# Patient Record
Sex: Male | Born: 2000 | Race: White | Hispanic: No | Marital: Married | Smoking: Never smoker
Health system: Southern US, Community
[De-identification: ages and names within clinical notes are randomized; demographics above are authoritative.]

## PROBLEM LIST (undated history)

## (undated) DIAGNOSIS — F845 Asperger's syndrome: Secondary | ICD-10-CM

## (undated) DIAGNOSIS — F809 Developmental disorder of speech and language, unspecified: Secondary | ICD-10-CM

## (undated) DIAGNOSIS — Q673 Plagiocephaly: Secondary | ICD-10-CM

## (undated) DIAGNOSIS — J45909 Unspecified asthma, uncomplicated: Secondary | ICD-10-CM

## (undated) DIAGNOSIS — F909 Attention-deficit hyperactivity disorder, unspecified type: Secondary | ICD-10-CM

## (undated) DIAGNOSIS — T7840XA Allergy, unspecified, initial encounter: Secondary | ICD-10-CM

## (undated) DIAGNOSIS — H9325 Central auditory processing disorder: Secondary | ICD-10-CM

## (undated) DIAGNOSIS — Z9109 Other allergy status, other than to drugs and biological substances: Secondary | ICD-10-CM

## (undated) DIAGNOSIS — K200 Eosinophilic esophagitis: Secondary | ICD-10-CM

## (undated) DIAGNOSIS — S098XXA Other specified injuries of head, initial encounter: Secondary | ICD-10-CM

## (undated) DIAGNOSIS — F819 Developmental disorder of scholastic skills, unspecified: Secondary | ICD-10-CM

## (undated) DIAGNOSIS — K219 Gastro-esophageal reflux disease without esophagitis: Secondary | ICD-10-CM

## (undated) HISTORY — DX: Other specified injuries of head, initial encounter: S09.8XXA

## (undated) HISTORY — DX: Plagiocephaly: Q67.3

## (undated) HISTORY — DX: Developmental disorder of speech and language, unspecified: F80.9

## (undated) HISTORY — DX: Developmental disorder of scholastic skills, unspecified: F81.9

## (undated) HISTORY — DX: Eosinophilic esophagitis: K20.0

## (undated) HISTORY — DX: Central auditory processing disorder: H93.25

## (undated) HISTORY — DX: Attention-deficit hyperactivity disorder, unspecified type: F90.9

## (undated) HISTORY — DX: Other allergy status, other than to drugs and biological substances: Z91.09

## (undated) HISTORY — PX: CIRCUMCISION: SUR203

## (undated) HISTORY — PX: UPPER GASTROINTESTINAL ENDOSCOPY: SHX188

## (undated) HISTORY — DX: Unspecified asthma, uncomplicated: J45.909

## (undated) HISTORY — DX: Gastro-esophageal reflux disease without esophagitis: K21.9

## (undated) HISTORY — DX: Allergy, unspecified, initial encounter: T78.40XA

## (undated) HISTORY — DX: Asperger's syndrome: F84.5

## (undated) HISTORY — PX: MANDIBLE FRACTURE SURGERY: SHX706

---
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
## 2019-05-17 ENCOUNTER — Ambulatory Visit: Payer: BLUE CROSS/BLUE SHIELD | Admitting: Podiatry

## 2019-05-17 ENCOUNTER — Ambulatory Visit: Payer: BLUE CROSS/BLUE SHIELD

## 2019-05-17 ENCOUNTER — Other Ambulatory Visit: Payer: Self-pay | Admitting: Podiatry

## 2019-05-17 ENCOUNTER — Ambulatory Visit (INDEPENDENT_AMBULATORY_CARE_PROVIDER_SITE_OTHER): Payer: BLUE CROSS/BLUE SHIELD

## 2019-05-17 ENCOUNTER — Encounter: Payer: Self-pay | Admitting: Podiatry

## 2019-05-17 ENCOUNTER — Other Ambulatory Visit: Payer: Self-pay

## 2019-05-17 VITALS — Temp 97.7°F

## 2019-05-17 DIAGNOSIS — S93491A Sprain of other ligament of right ankle, initial encounter: Secondary | ICD-10-CM

## 2019-05-17 DIAGNOSIS — R600 Localized edema: Secondary | ICD-10-CM | POA: Diagnosis not present

## 2019-05-17 DIAGNOSIS — S93621A Sprain of tarsometatarsal ligament of right foot, initial encounter: Secondary | ICD-10-CM

## 2019-05-17 DIAGNOSIS — M79671 Pain in right foot: Secondary | ICD-10-CM

## 2019-05-17 NOTE — Progress Notes (Signed)
Subjective:  Patient ID: Javier Greene, male    DOB: November 18, 2001,  MRN: [id number]  Chief Complaint  Patient presents with  . Foot Injury    Pt states sprained right foot 5 days ago when he slipped and bent his foot. Pt states pain has improved slightly since then but still has pain when walking and moving his toes on right foot.   18 y.o. male presents with the above complaint.  Mechanism of injury was slipping off of a tire from approximately a 8 inch height.   Review of Systems: Negative except as noted in the HPI. Denies N/V/F/Ch.  Past Medical History:  Diagnosis Date  . ADHD (attention deficit hyperactivity disorder)   . Allergy   . Asthma    severe when younger, now Albuterol MDI prn, no controllers  . Blunt head trauma 06/26/2000   [place] SeatFell forward and hit head on floor, seen in ER, CT SCAN normal  . Developmental speech or language disorder   . Environmental allergies 07/09/2012  . GERD (gastroesophageal reflux disease) 2001-02   Zantac, Prilosec. Resolved.  . Learning disability   . Neonatal jaundice    Peak bili 11.4  . Positional plagiocephaly    helmet, [facility]    Current Outpatient Medications:  .  EPINEPHrine (EPIPEN) 0.3 mg/0.3 mL DEVI, Inject 0.3 mLs (0.3 mg total) into the muscle once., Disp: 1 Device, Rfl: 1 .  levocetirizine (XYZAL) 5 MG tablet, Take 5 mg by mouth every evening., Disp: , Rfl:  .  methylphenidate (METADATE ER) 10 MG ER tablet, Take 1 tablet (10 mg total) by mouth every morning., Disp: 30 tablet, Rfl: 0 .  omeprazole (PRILOSEC) 20 MG capsule, Take by mouth., Disp: , Rfl:  .  cetirizine (ZYRTEC) 10 MG tablet, Take 1 tablet (10 mg total) by mouth daily., Disp: 30 tablet, Rfl: 1 .  fluticasone (FLONASE) 50 MCG/ACT nasal spray, Place 1 spray into both nostrils daily., Disp: 16 g, Rfl: 12  Social History   Tobacco Use  Smoking Status Never Smoker    Allergies  Allergen Reactions  . Latex Swelling  . Leguminosae [Peanut Oil] Nausea And Vomiting   . Shellfish Allergy Hives and Itching  . Eggs Or Egg-Derived Products Rash   Objective:   Vitals:   05/17/19 0900  Temp: 97.7 F (36.5 C)   There is no height or weight on file to calculate BMI. Constitutional Well developed. Well nourished.  Vascular Dorsalis pedis pulses palpable bilaterally. Posterior tibial pulses palpable bilaterally. Capillary refill normal to all digits.  No cyanosis or clubbing noted. Pedal hair growth normal.  Neurologic Normal speech. Oriented to person, place, and time. Epicritic sensation to light touch grossly present bilaterally.  Dermatologic Nails well groomed and normal in appearance. No open wounds. No skin lesions.  Orthopedic: POP R ATFL, No pain at CFL or Deltoids POP about the 1st/2nd TMTs with pain with piano key tests 1st/2nd TMTs   Radiographs: Taken and reviewed no acute fractures no evidence of Lisfranc dislocation no chip fracture. Assessment:   1. Sprain of anterior talofibular ligament of right ankle, initial encounter   2. Lisfranc's sprain, right, initial encounter   3. Localized edema    Plan:  Patient was evaluated and treated and all questions answered.  Right ATFL sprain, Lisfranc sprain, localized edema -X-rays reviewed as above -We will reduce edema with Unna boot -Immobilized in cam boot -Follow-up in 2 weeks with repeat weightbearing x-rays bilateral feet to prepare Lisfranc joint to make sure  there is no instability  Return in about 2 weeks (around 05/31/2019) for Ankle/Midfoot Sprain F/u with new XRs.

## 2019-05-31 ENCOUNTER — Ambulatory Visit (INDEPENDENT_AMBULATORY_CARE_PROVIDER_SITE_OTHER): Payer: BC Managed Care – PPO

## 2019-05-31 ENCOUNTER — Other Ambulatory Visit: Payer: Self-pay

## 2019-05-31 ENCOUNTER — Ambulatory Visit (INDEPENDENT_AMBULATORY_CARE_PROVIDER_SITE_OTHER): Payer: BC Managed Care – PPO | Admitting: Podiatry

## 2019-05-31 DIAGNOSIS — S93621D Sprain of tarsometatarsal ligament of right foot, subsequent encounter: Secondary | ICD-10-CM

## 2019-05-31 DIAGNOSIS — S93324S Dislocation of tarsometatarsal joint of right foot, sequela: Secondary | ICD-10-CM

## 2019-05-31 DIAGNOSIS — S93622S Sprain of tarsometatarsal ligament of left foot, sequela: Secondary | ICD-10-CM

## 2019-05-31 DIAGNOSIS — S93491D Sprain of other ligament of right ankle, subsequent encounter: Secondary | ICD-10-CM

## 2019-06-02 NOTE — Progress Notes (Signed)
Subjective:  Patient ID: Javier Greene, male    DOB: 2001-04-02,  MRN: [id number]  Chief Complaint  Patient presents with  . Ankle Injury    3 week follow up right ankle sprain   18 y.o. male presents with the above complaint.  States his ankle is feeling a little better still having pain in the midfoot.  Wearing the boot as directed  Review of Systems: Negative except as noted in the HPI. Denies N/V/F/Ch.  Past Medical History:  Diagnosis Date  . ADHD (attention deficit hyperactivity disorder)   . Allergy   . Asthma    severe when younger, now Albuterol MDI prn, no controllers  . Blunt head trauma 06/26/2000   [place] forward and hit head on floor, seen in ER, CT SCAN normal  . Developmental speech or language disorder   . Environmental allergies 07/09/2012  . GERD (gastroesophageal reflux disease) 2001-02   Zantac, Prilosec. Resolved.  . Learning disability   . Neonatal jaundice    Peak bili 11.4  . Positional plagiocephaly    helmet, [employer]    Current Outpatient Medications:  .  cetirizine (ZYRTEC) 10 MG tablet, Take 1 tablet (10 mg total) by mouth daily., Disp: 30 tablet, Rfl: 1 .  EPINEPHrine (EPIPEN) 0.3 mg/0.3 mL DEVI, Inject 0.3 mLs (0.3 mg total) into the muscle once., Disp: 1 Device, Rfl: 1 .  fluticasone (FLONASE) 50 MCG/ACT nasal spray, Place 1 spray into both nostrils daily., Disp: 16 g, Rfl: 12 .  levocetirizine (XYZAL) 5 MG tablet, Take 5 mg by mouth every evening., Disp: , Rfl:  .  methylphenidate (METADATE ER) 10 MG ER tablet, Take 1 tablet (10 mg total) by mouth every morning., Disp: 30 tablet, Rfl: 0 .  omeprazole (PRILOSEC) 20 MG capsule, Take by mouth., Disp: , Rfl:   Social History   Tobacco Use  Smoking Status Never Smoker    Allergies  Allergen Reactions  . Latex Swelling  . Leguminosae [Peanut Oil] Nausea And Vomiting  . Shellfish Allergy Hives and Itching  . Eggs Or Egg-Derived Products Rash   Objective:   There were no vitals filed for this  visit. There is no height or weight on file to calculate BMI. Constitutional Well developed. Well nourished.  Vascular Dorsalis pedis pulses palpable bilaterally. Posterior tibial pulses palpable bilaterally. Capillary refill normal to all digits.  No cyanosis or clubbing noted. Pedal hair growth normal.  Neurologic Normal speech. Oriented to person, place, and time. Epicritic sensation to light touch grossly present bilaterally.  Dermatologic Nails well groomed and normal in appearance. No open wounds. No skin lesions.  Orthopedic:  Pain palpation of the right ATFL but decreases prior.  Pain to palpation about the right second TMT.   Radiographs: Taken and reviewed bilaterally.  First and second intermetatarsal distance at the tarsometatarsal joints equal bilaterally without evidence of chip fracture or avulsion Assessment:   1. Sprain of anterior talofibular ligament of right ankle, subsequent encounter   2. Lisfranc's sprain, right, subsequent encounter   3. Lisfranc's sprain, left, sequela    Plan:  Patient was evaluated and treated and all questions answered.  Right ATFL sprain, Lisfranc sprain, localized edema -Bilateral x-rays weightbearing performed as above.  He is still having some tenderness at the Lisfranc ligament and so we will immobilize him in a Tri-Lock ankle brace with additional foot strap to prevent midfoot inversion eversion.  He will be allowed to weight-bear in a normal shoe.  He will follow-up in 2  weeks for re-eval  Return in about 2 weeks (around 06/14/2019) for Sprain f/u right .

## 2019-06-13 ENCOUNTER — Ambulatory Visit: Payer: BC Managed Care – PPO | Admitting: Podiatry

## 2022-06-23 ENCOUNTER — Encounter: Payer: Self-pay | Admitting: Internal Medicine

## 2022-08-12 ENCOUNTER — Encounter: Payer: Self-pay | Admitting: *Deleted

## 2022-08-16 ENCOUNTER — Encounter: Payer: Self-pay | Admitting: Internal Medicine

## 2022-08-16 ENCOUNTER — Ambulatory Visit (INDEPENDENT_AMBULATORY_CARE_PROVIDER_SITE_OTHER): Payer: BC Managed Care – PPO | Admitting: Internal Medicine

## 2022-08-16 VITALS — BP 124/88 | HR 81 | Ht 71.0 in | Wt 180.5 lb

## 2022-08-16 DIAGNOSIS — R1310 Dysphagia, unspecified: Secondary | ICD-10-CM | POA: Diagnosis not present

## 2022-08-16 DIAGNOSIS — R112 Nausea with vomiting, unspecified: Secondary | ICD-10-CM | POA: Diagnosis not present

## 2022-08-16 DIAGNOSIS — K219 Gastro-esophageal reflux disease without esophagitis: Secondary | ICD-10-CM

## 2022-08-16 NOTE — Patient Instructions (Signed)
If you are age 23 or older, your body mass index should be between 23-30. Your Body mass index is 25.17 kg/m. If this is out of the aforementioned range listed, please consider follow up with your Primary Care Provider.  If you are age 46 or younger, your body mass index should be between 19-25. Your Body mass index is 25.17 kg/m. If this is out of the aformentioned range listed, please consider follow up with your Primary Care Provider.   You have been scheduled for an endoscopy. Please follow written instructions given to you at your visit today. If you use inhalers (even only as needed), please bring them with you on the day of your procedure.  The [facility] providers would like to encourage you to use [facility] to communicate with providers for non-urgent requests or questions.  Due to long hold times on the telephone, sending your provider a message by [facility] may be a faster and more efficient way to get a response.  Please allow 48 business hours for a response.  Please remember that this is for non-urgent requests.   It was a pleasure to see you today!  Thank you for trusting me with your gastrointestinal care!

## 2022-08-16 NOTE — Progress Notes (Signed)
   Subjective:    Patient ID: Javier Greene, male    DOB: January 18, 2001, 21 y.o.   MRN: [id number]  HPI [name] is a 21 year old male with a history of esophageal dysphagia and reflux who is seen in consultation at the request of Calla Kicks, NP to evaluate mid abdominal pain and dysphagia symptom.  He is here today with his stepparents.  Records reviewed including ER visit from June 2019 where he presented after what sounds like esophageal food impaction.  This did not require EGD at that time. He also had an esophagram done at 12 months old which showed free reflux.  He reports that he has issues with solid food dysphagia.  This occurs on a regular though not daily basis.  Worse with foods like chicken, bread and rice.  Has to push food down with fluids.  Does not have to bring it back up.  At times it feels like food moves slowly into his stomach.  Occasional nausea and on 3 times he vomited after eating.  He recalls a coughing sensation followed by vomiting of food recently eaten. No nausea or vomiting.  He has some heartburn.  No belching symptom.  Has dealt with this for so long that he cannot really recall when it was not occurring intermittently.  Is currently taking no medications other than daily Zyrtec for allergies.  Bowel movements are normal for him which occur 1-3 times a day.  Can be loose at times other times are normal.  Rarely constipated.  No blood in stool or melena.  Lives in [place].  No tobacco use.  Rare alcohol use.  Is engaged to be married with plans for a wedding in 2025.   Review of Systems As per HPI, otherwise negative  Current Medications, Allergies, Past Medical History, Past Surgical History, Family History and Social History were reviewed in [employer] record.    Objective:   Physical Exam BP 124/88   Pulse 81   Ht 5\' 11"  (1.803 m)   Wt 180 lb 8 oz (81.9 kg)   BMI 25.17 kg/m  Gen: awake, alert, NAD HEENT: anicteric  CV: RRR,  no mrg Pulm: CTA b/l Abd: soft, NT/ND, +BS throughout Ext: no c/c/e Neuro: nonfocal      Assessment & Plan:  21 year old male with a history of esophageal dysphagia and reflux who is seen in consultation at the request of 30, NP to evaluate mid abdominal pain and dysphagia symptom.   Dysphagia/upper abdominal pain/heartburn/nausea vomiting --he has symptoms of GERD which are longstanding in nature and in 2019 had a transient esophageal food impaction.  This raises the question of GERD with esophagitis, esophageal stricture or EOE.  He is not currently taking any acid suppression therapy.  I recommended the following: -- Upper endoscopy in the [facility] with possible dilation; we discussed the risk, benefits and alternatives and he is agreeable and wishes to proceed -- We discussed the need for possible acid suppression therapy but will hold off until after upper endoscopy

## 2022-08-18 ENCOUNTER — Encounter: Payer: Self-pay | Admitting: Internal Medicine

## 2022-08-18 ENCOUNTER — Ambulatory Visit (AMBULATORY_SURGERY_CENTER): Payer: BC Managed Care – PPO | Admitting: Internal Medicine

## 2022-08-18 VITALS — BP 113/73 | HR 52 | Temp 98.2°F | Resp 15 | Ht 71.0 in | Wt 180.0 lb

## 2022-08-18 DIAGNOSIS — R1319 Other dysphagia: Secondary | ICD-10-CM

## 2022-08-18 DIAGNOSIS — K449 Diaphragmatic hernia without obstruction or gangrene: Secondary | ICD-10-CM

## 2022-08-18 DIAGNOSIS — R12 Heartburn: Secondary | ICD-10-CM

## 2022-08-18 DIAGNOSIS — R112 Nausea with vomiting, unspecified: Secondary | ICD-10-CM | POA: Diagnosis not present

## 2022-08-18 DIAGNOSIS — K200 Eosinophilic esophagitis: Secondary | ICD-10-CM | POA: Diagnosis not present

## 2022-08-18 DIAGNOSIS — K630 Abscess of intestine: Secondary | ICD-10-CM | POA: Diagnosis not present

## 2022-08-18 DIAGNOSIS — K219 Gastro-esophageal reflux disease without esophagitis: Secondary | ICD-10-CM

## 2022-08-18 MED ORDER — FLUTICASONE PROPIONATE HFA 220 MCG/ACT IN AERO: 2.0000 | INHALATION_SPRAY | Freq: Two times a day (BID) | RESPIRATORY_TRACT | Status: DC

## 2022-08-18 MED ORDER — FLUTICASONE PROPIONATE HFA 220 MCG/ACT IN AERO: 2.0000 | INHALATION_SPRAY | Freq: Two times a day (BID) | RESPIRATORY_TRACT | 0 refills | Status: DC

## 2022-08-18 MED ORDER — PANTOPRAZOLE SODIUM 40 MG PO TBEC: 40.0000 mg | DELAYED_RELEASE_TABLET | Freq: Every day | ORAL | 3 refills | Status: DC

## 2022-08-18 MED ORDER — SODIUM CHLORIDE 0.9 % IV SOLN: 500.0000 mL | Freq: Once | INTRAVENOUS | Status: DC

## 2022-08-18 MED ORDER — FLUTICASONE PROPIONATE HFA 220 MCG/ACT IN AERO: 2.0000 | INHALATION_SPRAY | Freq: Two times a day (BID) | RESPIRATORY_TRACT | 1 refills | Status: DC

## 2022-08-18 NOTE — Progress Notes (Signed)
.  rm °

## 2022-08-18 NOTE — Patient Instructions (Addendum)
Pick up new Rx's!!  See post dilation diet handout for diet instructions!  Proceed to clear liquids then soft diet as tolerated, tomorrow proceed to your normal diet as tolerated.  Repeat EGD in 3 months, call to schedule in the next month.  YOU HAD AN ENDOSCOPIC PROCEDURE TODAY AT THE [facility]:   Refer to the procedure report that was given to you for any specific questions about what was found during the examination.  If the procedure report does not answer your questions, please call your gastroenterologist to clarify.  If you requested that your care partner not be given the details of your procedure findings, then the procedure report has been included in a sealed envelope for you to review at your convenience later.  YOU SHOULD EXPECT: Some feelings of bloating in the abdomen. Passage of more gas than usual.  Walking can help get rid of the air that was put into your GI tract during the procedure and reduce the bloating.  Please Note:  You might notice some irritation and congestion in your nose or some drainage.  This is from the oxygen used during your procedure.  There is no need for concern and it should clear up in a day or so.  SYMPTOMS TO REPORT IMMEDIATELY:  Following upper endoscopy (EGD)  Vomiting of blood or coffee ground material  New chest pain or pain under the shoulder blades  Painful or persistently difficult swallowing  New shortness of breath  Fever of 100F or higher  Black, tarry-looking stools  For urgent or emergent issues, a gastroenterologist can be reached at any hour by calling [phone]. Do not use MyChart messaging for urgent concerns.    DIET:  SEE POST DILATION DIET HANDOUT!! Drink plenty of fluids but you should avoid alcoholic beverages for 24 hours.  ACTIVITY:  You should plan to take it easy for the rest of today and you should NOT DRIVE or use heavy machinery until tomorrow (because of the sedation medicines used during the  test).    FOLLOW UP: Our staff will call the number listed on your records the next business day following your procedure.  We will call around 7:15- 8:00 am to check on you and address any questions or concerns that you may have regarding the information given to you following your procedure. If we do not reach you, we will leave a message.  If you develop any symptoms (ie: fever, flu-like symptoms, shortness of breath, cough etc.) before then, please call [phone].  If you test positive for Covid 19 in the 2 weeks post procedure, please call and report this information to [place].    If any biopsies were taken you will be contacted by phone or by letter within the next 1-3 weeks.  Please call us at [phone] if you have not heard about the biopsies in 3 weeks.    SIGNATURES/CONFIDENTIALITY: You and/or your care partner have signed paperwork which will be entered into your electronic medical record.  These signatures attest to the fact that that the information above on your After Visit Summary has been reviewed and is understood.  Full responsibility of the confidentiality of this discharge information lies with you and/or your care-partner.

## 2022-08-18 NOTE — Op Note (Signed)
[facility] Patient Name: Javier Greene Procedure Date: 08/18/2022 9:06 AM MRN: [id number] Endoscopist: Beverley Fiedler , MD Age: 21 Referring MD:  Date of Birth: July 26, 2001 Gender: Male Account #: [id number] Procedure:                Upper GI endoscopy Indications:              Dysphagia, Nausea with vomiting Medicines:                Monitored Anesthesia Care Procedure:                Pre-Anesthesia Assessment:                           - Prior to the procedure, a History and Physical                            was performed, and patient medications and                            allergies were reviewed. The patient's tolerance of                            previous anesthesia was also reviewed. The risks                            and benefits of the procedure and the sedation                            options and risks were discussed with the patient.                            All questions were answered, and informed consent                            was obtained. Prior Anticoagulants: The patient has                            taken no previous anticoagulant or antiplatelet                            agents. ASA Grade Assessment: II - A patient with                            mild systemic disease. After reviewing the risks                            and benefits, the patient was deemed in                            satisfactory condition to undergo the procedure.                           After obtaining informed consent, the endoscope was  passed under direct vision. Throughout the                            procedure, the patient's blood pressure, pulse, and                            oxygen saturations were monitored continuously. The                            Endoscope was introduced through the mouth, and                            advanced to the second part of duodenum. The upper                            GI endoscopy was accomplished  without difficulty.                            The patient tolerated the procedure well. Scope In: Scope Out: Findings:                 Mucosal changes including feline appearance,                            longitudinal furrows and congestion (edema) were                            found in the entire esophagus. Esophageal findings                            were graded using the Eosinophilic Esophagitis                            Endoscopic Reference Score (EoE-EREFS) as: Edema                            Grade 1 Present (decreased clarity or absence of                            vascular markings), Rings Grade 2 Moderate                            (distinct rings that do not occlude passage of                            diagnostic 8-10 mm endoscope), Exudates Grade 1                            Mild (scattered white lesions involving less than                            10 percent of the esophageal surface area) and  Furrows Grade 1 Present (vertical lines with or                            without visible depth). Biopsies were obtained from                            the proximal and distal esophagus with cold forceps                            for histology of suspected eosinophilic                            esophagitis. On scope withdrawal there were                            multiple linear mucosal rents consistent with                            dilation effect from passage of the adult upper                            endoscope.                           A 2 cm hiatal hernia was present.                           The entire examined stomach was normal.                           The examined duodenum was normal. Complications:            No immediate complications. Estimated Blood Loss:     Estimated blood loss was minimal. 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  Biopsied. Effectively                            dilated by scope passage.                           - 2 cm hiatal hernia.                           - Normal stomach.                           - Normal examined duodenum. Recommendation:           - Patient has a contact number available for                            emergencies. The signs and symptoms of potential  delayed complications were discussed with the                            patient. Return to normal activities tomorrow.                            Written discharge instructions were provided to the                            patient.                           - Post-dilation diet, then advance diet as                            tolerated.                           - Continue present medications. Begin pantoprazole                            40 mg once daily.                           - Begin fluticasone 220 mcg; 2 sprays swallowed                            twice daily x 6 weeks (do not use a spacer and do                            not eat or drink for 30 min before or after using                            this medication)                           - Await pathology results.                           - Repeat EGD in 3 months in [facility].                           - Referral to Dr. Irena Cords for food allergy                            testing given EoE. Beverley Fiedler, MD 08/18/2022 9:29:32 AM This report has been signed electronically.

## 2022-08-18 NOTE — Progress Notes (Signed)
Report to PACU, RN, vss, BBS= Clear.  

## 2022-08-18 NOTE — Progress Notes (Signed)
See office note dated 08/16/2022 for details and current H&P  Patient presenting for upper endoscopy to evaluate dysphagia symptom. He is appropriate for upper endoscopy in the [facility] today.

## 2022-08-18 NOTE — Progress Notes (Signed)
VS by DT  Pt's states no medical or surgical changes since previsit or office visit.  

## 2022-08-19 ENCOUNTER — Telehealth: Payer: Self-pay | Admitting: *Deleted

## 2022-08-19 NOTE — Telephone Encounter (Signed)
  Follow up Call-     08/18/2022    8:21 AM  Call back number  Post procedure Call Back phone  # [phone]  Permission to leave phone message Yes     Patient questions:  Do you have a fever, pain , or abdominal swelling? No. Pain Score  0 *  Have you tolerated food without any problems? Yes.    Have you been able to return to your normal activities? Yes.    Do you have any questions about your discharge instructions: Diet   No. Medications  No. Follow up visit  No.  Do you have questions or concerns about your Care? No.  Actions: * If pain score is 4 or above: No action needed, pain <4.

## 2022-08-24 ENCOUNTER — Other Ambulatory Visit (HOSPITAL_COMMUNITY): Payer: Self-pay

## 2022-08-24 ENCOUNTER — Telehealth: Payer: Self-pay

## 2022-08-24 NOTE — Telephone Encounter (Signed)
Referral faxed to Dr. Madie Reno for food allergy testing to [phone]-Attn [facility]. Recall entered in epic for EGD in  [facility] in 3 mths.

## 2022-08-24 NOTE — Telephone Encounter (Signed)
Patient Advocate Encounter   Received notification from [facility] that prior authorization is required for Fluticasone 220 HFA  This insurance prefers Pulmicort flexhaler and will not cover Fluticasone without documentation of trial/failure, or contraindication of Pulmicort. No record of previous use of Pulmicort in this patient chart.  Burnell Blanks, CPhT Rx Patient Advocate Phone: [phone]

## 2022-08-28 NOTE — Telephone Encounter (Signed)
Dry inhaler as requested by insurance provider is not ideal  This med needs to be swallowed and not inhaled for EoE Would try fluticasone 110 mcg/spray -- 4 sprays BID -- sometimes this is covered better Let me know if not covered, he needs the med for EoE

## 2022-08-29 MED ORDER — FLUTICASONE PROPIONATE HFA 110 MCG/ACT IN AERO
INHALATION_SPRAY | RESPIRATORY_TRACT | 0 refills | Status: DC
Start: 2022-08-29 — End: 2022-10-26

## 2022-08-29 NOTE — Telephone Encounter (Signed)
Updated script sent to pharmacy   

## 2022-08-29 NOTE — Addendum Note (Signed)
Addended by: Richardson Chiquito on: 08/29/2022 09:19 AM   Modules accepted: Orders

## 2022-09-05 ENCOUNTER — Other Ambulatory Visit (HOSPITAL_COMMUNITY): Payer: Self-pay

## 2022-09-06 ENCOUNTER — Telehealth: Payer: Self-pay

## 2022-09-06 ENCOUNTER — Other Ambulatory Visit (HOSPITAL_COMMUNITY): Payer: Self-pay

## 2022-09-06 NOTE — Telephone Encounter (Signed)
Patient Advocate Encounter  Received a fax regarding Prior Authorization for Fluticasone Propionate HFA 110MCG/ACT.   Authorization has been DENIED due to *Non-Formulary- Your request for coverage was denied because your prescription benefit plan does not cover the requested medication. The above adverse benefit determination is in accordance with Section 4.01 of the [place]. This decision relates specifically to coverage provided under your prescription benefit plan and does not involve any determination of medical judgement.  Key: [id number]

## 2022-09-07 ENCOUNTER — Other Ambulatory Visit (HOSPITAL_COMMUNITY): Payer: Self-pay

## 2022-09-08 ENCOUNTER — Other Ambulatory Visit (HOSPITAL_COMMUNITY): Payer: Self-pay

## 2022-09-08 NOTE — Telephone Encounter (Signed)
Please advise Dr. Hilarie Fredrickson if you want second appeal.

## 2022-09-08 NOTE — Telephone Encounter (Signed)
Received a fax regarding appeal to Fluticasone Propionate HFA 110MCG/ACT.   Appeal has been Denied.  Determination letter attached to chart

## 2022-09-12 ENCOUNTER — Other Ambulatory Visit (HOSPITAL_COMMUNITY): Payer: Self-pay

## 2022-09-12 NOTE — Telephone Encounter (Signed)
In someway we need a swallowed steroid approved for this patient's eosinophilic esophagitis It is incredibly and hard to imagine why this is so difficult to get coverage as this is standard of care for EoE  Either fluticasone at the equivalent dose of 220 mcg/spray, 2 sprays twice daily or budesonide slurry twice daily for 6 weeks  Let me know if you need documentation for me

## 2022-09-15 ENCOUNTER — Encounter: Payer: BC Managed Care – PPO | Admitting: Internal Medicine

## 2022-09-16 ENCOUNTER — Other Ambulatory Visit (HOSPITAL_COMMUNITY): Payer: Self-pay

## 2022-09-19 NOTE — Telephone Encounter (Signed)
Yes this is acceptable and thanks for working to make this happen The pt needs this med Budesonide slurry 2 mg PO BID (oral slurry solution), instruction should be nothing to eat or drink within 30 min on either side of dose.

## 2022-09-20 NOTE — Telephone Encounter (Signed)
Pts mother states they paid out of pocket for the inhaler and pt has been taking it. She thought this was short term and that is why they went ahead and paid for the inhaler. She does not think he would be able to swallow the slurry without throwing up. Is the inhaler only for short term? Please advise.

## 2022-09-20 NOTE — Telephone Encounter (Signed)
Javier Greene, can you help here to see where pt wants med. Ok for [employer] [facility] or [facility] outpt if easier [employer]

## 2022-09-20 NOTE — Telephone Encounter (Signed)
Left detailed message on mother's voicemail that the inhaler is supposed to be short term. Pt scheduled to see Dr. Hilarie Fredrickson 10/26/22 at 3:40pm. Per Dr. Marjie Skiff office they are behind on referrals and he should be called soon for an appt but they can call the office themselves to go ahead and schedule if they want. Office number given to call [phone].

## 2022-09-20 NOTE — Telephone Encounter (Signed)
Yes, 6-8 weeks dependent on response. Short term expected  He will need followup with me if not in place Thanks JMP

## 2022-10-21 ENCOUNTER — Encounter: Payer: Self-pay | Admitting: *Deleted

## 2022-10-26 ENCOUNTER — Encounter: Payer: Self-pay | Admitting: Internal Medicine

## 2022-10-26 ENCOUNTER — Ambulatory Visit (INDEPENDENT_AMBULATORY_CARE_PROVIDER_SITE_OTHER): Payer: BC Managed Care – PPO | Admitting: Internal Medicine

## 2022-10-26 VITALS — BP 118/82 | HR 83 | Ht 71.0 in | Wt 184.0 lb

## 2022-10-26 DIAGNOSIS — K200 Eosinophilic esophagitis: Secondary | ICD-10-CM

## 2022-10-26 DIAGNOSIS — K222 Esophageal obstruction: Secondary | ICD-10-CM

## 2022-10-26 NOTE — Patient Instructions (Addendum)
   If you are age 23 or older, your body mass index should be between 23-30. Your Body mass index is 25.66 kg/m. If this is out of the aforementioned range listed, please consider follow up with your Primary Care Provider.  If you are age 57 or younger, your body mass index should be between 19-25. Your Body mass index is 25.66 kg/m. If this is out of the aformentioned range listed, please consider follow up with your Primary Care Provider.   ________________________________________________________  The [facility] providers would like to encourage you to use [facility] to communicate with providers for non-urgent requests or questions.  Due to long hold times on the telephone, sending your provider a message by [facility] may be a faster and more efficient way to get a response.  Please allow 48 business hours for a response.  Please remember that this is for non-urgent requests.  _______________________________________________________  Bonita Quin have been scheduled for an endoscopy. Please follow written instructions given to you at your visit today. If you use inhalers (even only as needed), please bring them with you on the day of your procedure.   Due to recent changes in healthcare laws, you may see the results of your imaging and laboratory studies on MyChart before your provider has had a chance to review them.  We understand that in some cases there may be results that are confusing or concerning to you. Not all laboratory results come back in the same time frame and the provider may be waiting for multiple results in order to interpret others.  Please give [place] 48 hours in order for your provider to thoroughly review all the results before contacting the office for clarification of your results.    Continue Pantoprazole  Discontinue fluticasone  Thank you for entrusting me with your care and choosing [facility].  Dr Rhea Belton

## 2022-10-26 NOTE — Progress Notes (Signed)
   Subjective:    Patient ID: Javier Greene, male    DOB: 2001/10/23, 23 y.o.   MRN: [id number]  HPI Deryck Hippler is a 21 year old male with a recent diagnosis of EoE with esophageal dysphagia and GERD who is here for follow-up.  He was seen in the office on 08/16/2022 and for upper endoscopy on 08/18/2022.  EGD revealed endoscopic mucosal changes consistent with EOE is well as moderate distinct rings throughout the middle and lower esophagus.  A 2 cm hiatal hernia was present.  The stomach and examined duodenum were normal.  Mucosal rents were caused in the esophagus just by passing the adult upper endoscope.  Biopsies showed eosinophils too numerous to count.  There was also submucosal fibrosis suggestive of possible longstanding EoE.  He has been adherent with once daily pantoprazole but also using fluticasone.  He is currently using 1 spray twice daily.  His reflux symptoms have resolved.  His swallowing is much improved.  He has had no further nausea.  He has had no food sticking and is able to eat what he wants.  He occasionally feels like solid foods like meat go down slowly but nothing is getting stuck like it used to.  He did have 2 episodes of mild nausea after traveling in a car from [place] to [place] and he wondered if this was possibly a medication side effect.   Review of Systems As per HPI, otherwise negative  Current Medications, Allergies, Past Medical History, Past Surgical History, Family History and Social History were reviewed in [employer] record.    Objective:   Physical Exam BP 118/82   Pulse 83   Ht 5\' 11"  (1.803 m)   Wt 184 lb (83.5 kg)   BMI 25.66 kg/m  Gen: awake, alert, NAD HEENT: anicteric Neuro: nonfocal      Assessment & Plan:  21 year old male with a recent diagnosis of EoE with esophageal dysphagia and GERD who is here for follow-up.    EoE with esophageal stenoses --we discussed the biopsy results as well as the  firm diagnosis of EoE today.  He is symptomatically better with PPI but also swallowed fluticasone therapy.  Given the degree of esophageal narrowing I recommended repeat upper endoscopy to assess disease activity and also repeat dilation.  We discussed the risk, benefits and alternatives and he is agreeable and wishes to proceed -- Continue pantoprazole 40 mg once daily -- Can discontinue swallowed fluticasone at this time after 8 weeks of therapy -- EGD in the [facility] with dilation  20 minutes total spent today including patient facing time, coordination of care, reviewing medical history/procedures/pertinent radiology studies, and documentation of the encounter.

## 2022-11-25 ENCOUNTER — Encounter: Payer: Self-pay | Admitting: Internal Medicine

## 2022-11-25 ENCOUNTER — Ambulatory Visit (AMBULATORY_SURGERY_CENTER): Payer: BC Managed Care – PPO | Admitting: Internal Medicine

## 2022-11-25 ENCOUNTER — Telehealth: Payer: Self-pay | Admitting: *Deleted

## 2022-11-25 VITALS — BP 120/64 | HR 83 | Temp 99.1°F | Resp 12 | Ht 71.0 in | Wt 184.0 lb

## 2022-11-25 DIAGNOSIS — K449 Diaphragmatic hernia without obstruction or gangrene: Secondary | ICD-10-CM | POA: Diagnosis not present

## 2022-11-25 DIAGNOSIS — K200 Eosinophilic esophagitis: Secondary | ICD-10-CM

## 2022-11-25 DIAGNOSIS — K229 Disease of esophagus, unspecified: Secondary | ICD-10-CM

## 2022-11-25 DIAGNOSIS — K222 Esophageal obstruction: Secondary | ICD-10-CM | POA: Diagnosis not present

## 2022-11-25 DIAGNOSIS — K3189 Other diseases of stomach and duodenum: Secondary | ICD-10-CM | POA: Diagnosis not present

## 2022-11-25 MED ORDER — SODIUM CHLORIDE 0.9 % IV SOLN: 500.0000 mL | Freq: Once | INTRAVENOUS | Status: DC

## 2022-11-25 NOTE — Patient Instructions (Addendum)
Please read handouts provided. Continue present medications, Pantoprazole 40 mg daily should be continued. Await pathology results. Repeat EGD as needed.    YOU HAD AN ENDOSCOPIC PROCEDURE TODAY AT THE [facility]:   Refer to the procedure report that was given to you for any specific questions about what was found during the examination.  If the procedure report does not answer your questions, please call your gastroenterologist to clarify.  If you requested that your care partner not be given the details of your procedure findings, then the procedure report has been included in a sealed envelope for you to review at your convenience later.  YOU SHOULD EXPECT: Some feelings of bloating in the abdomen. Passage of more gas than usual.  Walking can help get rid of the air that was put into your GI tract during the procedure and reduce the bloating. If you had a lower endoscopy (such as a colonoscopy or flexible sigmoidoscopy) you may notice spotting of blood in your stool or on the toilet paper. If you underwent a bowel prep for your procedure, you may not have a normal bowel movement for a few days.  Please Note:  You might notice some irritation and congestion in your nose or some drainage.  This is from the oxygen used during your procedure.  There is no need for concern and it should clear up in a day or so.  SYMPTOMS TO REPORT IMMEDIATELY:   Following upper endoscopy (EGD)  Vomiting of blood or coffee ground material  New chest pain or pain under the shoulder blades  Painful or persistently difficult swallowing  New shortness of breath  Fever of 100F or higher  Black, tarry-looking stools  For urgent or emergent issues, a gastroenterologist can be reached at any hour by calling [phone]. Do not use MyChart messaging for urgent concerns.    DIET:  We do recommend a small meal at first, but then you may proceed to your regular diet.  Drink plenty of fluids but you  should avoid alcoholic beverages for 24 hours.  ACTIVITY:  You should plan to take it easy for the rest of today and you should NOT DRIVE or use heavy machinery until tomorrow (because of the sedation medicines used during the test).    FOLLOW UP: Our staff will call the number listed on your records the next business day following your procedure.  We will call around 7:15- 8:00 am to check on you and address any questions or concerns that you may have regarding the information given to you following your procedure. If we do not reach you, we will leave a message.     If any biopsies were taken you will be contacted by phone or by letter within the next 1-3 weeks.  Please call us at [phone] if you have not heard about the biopsies in 3 weeks.    SIGNATURES/CONFIDENTIALITY: You and/or your care partner have signed paperwork which will be entered into your electronic medical record.  These signatures attest to the fact that that the information above on your After Visit Summary has been reviewed and is understood.  Full responsibility of the confidentiality of this discharge information lies with you and/or your care-partner.

## 2022-11-25 NOTE — Op Note (Signed)
[facility] Patient Name: Javier Greene Procedure Date: 11/25/2022 8:31 AM MRN: [id number] Endoscopist: Jerene Bears , MD, [id number] Age: 21 Referring MD:  Date of Birth: 07-Jan-2001 Gender: Male Account #: [id number] Procedure:                Upper GI endoscopy Indications:              Follow-up of eosinophilic esophagitis dx Aug 2023                            and dilation with endoscope passage at that time,                            treated with fluticasone now completed, pt                            continues daily PPI Medicines:                Monitored Anesthesia Care Procedure:                Pre-Anesthesia Assessment:                           - Prior to the procedure, a History and Physical                            was performed, and patient medications and                            allergies were reviewed. The patient's tolerance of                            previous anesthesia was also reviewed. The risks                            and benefits of the procedure and the sedation                            options and risks were discussed with the patient.                            All questions were answered, and informed consent                            was obtained. Prior Anticoagulants: The patient has                            taken no anticoagulant or antiplatelet agents. ASA                            Grade Assessment: II - A patient with mild systemic                            disease. After reviewing the risks and benefits,  the patient was deemed in satisfactory condition to                            undergo the procedure.                           After obtaining informed consent, the endoscope was                            passed under direct vision. Throughout the                            procedure, the patient's blood pressure, pulse, and                            oxygen saturations were monitored  continuously. The                            Endoscope was introduced through the mouth, and                            advanced to the second part of duodenum. The upper                            GI endoscopy was accomplished without difficulty.                            The patient tolerated the procedure well. Scope In: Scope Out: Findings:                 Mucosal changes including circumferential folds                            were found in the middle third of the esophagus and                            in the lower third of the esophagus. Esophageal                            findings were graded using the Eosinophilic                            Esophagitis Endoscopic Reference Score (EoE-EREFS)                            as: Edema Grade 0 Normal (distinct vascular                            markings), Rings Grade 1 Mild (subtle                            circumferential ridges seen on esophageal                            distension), Exudates Grade  0 None (no white                            lesions seen) and Furrows Grade 0 None (no vertical                            lines seen). Biopsies were obtained from the                            proximal and distal esophagus with cold forceps for                            histology of suspected eosinophilic esophagitis.                           One benign-appearing, intrinsic moderate                            (circumferential scarring or stenosis; an endoscope                            may pass) stenosis was found 40 cm from the                            incisors. This stenosis measured 1.3 cm (inner                            diameter). The stenosis was traversed. A TTS                            dilator was passed through the scope. Dilation with                            a 16-17-18 mm balloon dilator was performed to 18                            mm.                           The Z-line was irregular and was found 40 cm  from                            the incisors. Biopsies were taken with a cold                            forceps for histology.                           A 1-2 cm hiatal hernia was present.                           The entire examined stomach was normal.  The examined duodenum was normal. Complications:            No immediate complications. Estimated Blood Loss:     Estimated blood loss was minimal. Impression:               - Esophageal mucosal changes secondary to                            eosinophilic esophagitis. Much improved compared to                            Aug 2023.                           - Benign-appearing esophageal stenosis. Dilated to                            18 mm with balloon.                           - Z-line irregular, 40 cm from the incisors.                            Biopsied.                           - 1-2 cm hiatal hernia.                           - Normal stomach.                           - Normal examined duodenum.                           - Biopsies were taken with a cold forceps for                            evaluation of eosinophilic esophagitis. Recommendation:           - Patient has a contact number available for                            emergencies. The signs and symptoms of potential                            delayed complications were discussed with the                            patient. Return to normal activities tomorrow.                            Written discharge instructions were provided to the                            patient.                           -  Resume previous diet.                           - Continue present medications. Pantoprazole 40 mg                            daily should be continued.                           - Await pathology results.                           - Repeat EGD as needed. Beverley Fiedler, MD 11/25/2022 8:59:10 AM This report has been signed electronically.

## 2022-11-25 NOTE — Progress Notes (Signed)
Called to room to assist during endoscopic procedure.  Patient ID and intended procedure confirmed with present staff. Received instructions for my participation in the procedure from the performing physician.  

## 2022-11-25 NOTE — Progress Notes (Signed)
Sedate, gd SR, tolerated procedure well, VSS, report to RN 

## 2022-11-25 NOTE — Progress Notes (Signed)
Please see office note dated 10/26/2022 for details and current H&P  Patient presenting for EGD to eval EoE  He is appropriate for EGD here today

## 2022-11-25 NOTE — Telephone Encounter (Signed)
19 pages of demographics and pertinent records have been faxed to Dr Irena Cords for review. Patient with eosinophilic esophagitis. Will await appointment information.

## 2022-11-25 NOTE — Progress Notes (Signed)
Pt's states no medical or surgical changes since previsit or office visit. 

## 2022-11-28 ENCOUNTER — Telehealth: Payer: Self-pay | Admitting: *Deleted

## 2022-11-28 NOTE — Telephone Encounter (Signed)
Attempted follow up, reached busy signal multiple times. Unable to leave a message.

## 2022-12-20 NOTE — Telephone Encounter (Signed)
Patient has scheduled an appointment with [facility] for 01/17/23.

## 2023-04-24 ENCOUNTER — Other Ambulatory Visit: Payer: Self-pay | Admitting: Internal Medicine

## 2023-05-18 ENCOUNTER — Emergency Department (HOSPITAL_BASED_OUTPATIENT_CLINIC_OR_DEPARTMENT_OTHER): Payer: BC Managed Care – PPO

## 2023-05-18 ENCOUNTER — Emergency Department (HOSPITAL_BASED_OUTPATIENT_CLINIC_OR_DEPARTMENT_OTHER)
Admission: EM | Admit: 2023-05-18 | Discharge: 2023-05-18 | Disposition: A | Discharge status: Home / Self Care | Payer: BC Managed Care – PPO | Attending: Emergency Medicine | Admitting: Emergency Medicine

## 2023-05-18 ENCOUNTER — Other Ambulatory Visit: Payer: Self-pay

## 2023-05-18 ENCOUNTER — Encounter (HOSPITAL_BASED_OUTPATIENT_CLINIC_OR_DEPARTMENT_OTHER): Payer: Self-pay

## 2023-05-18 DIAGNOSIS — W57XXXA Bitten or stung by nonvenomous insect and other nonvenomous arthropods, initial encounter: Secondary | ICD-10-CM | POA: Insufficient documentation

## 2023-05-18 DIAGNOSIS — Z91040 Latex allergy status: Secondary | ICD-10-CM | POA: Diagnosis not present

## 2023-05-18 DIAGNOSIS — Z91010 Allergy to peanuts: Secondary | ICD-10-CM | POA: Insufficient documentation

## 2023-05-18 DIAGNOSIS — M79672 Pain in left foot: Secondary | ICD-10-CM | POA: Diagnosis present

## 2023-05-18 DIAGNOSIS — S90862A Insect bite (nonvenomous), left foot, initial encounter: Secondary | ICD-10-CM | POA: Insufficient documentation

## 2023-05-18 LAB — CBC
HCT: 45.5 % (ref 39.0–52.0)
Hemoglobin: 15.9 g/dL (ref 13.0–17.0)
MCH: 28.4 pg (ref 26.0–34.0)
MCHC: 34.9 g/dL (ref 30.0–36.0)
MCV: 81.4 fL (ref 80.0–100.0)
Platelets: 176 10*3/uL (ref 150–400)
RBC: 5.59 MIL/uL (ref 4.22–5.81)
RDW: 13.1 % (ref 11.5–15.5)
WBC: 9.3 10*3/uL (ref 4.0–10.5)
nRBC: 0 % (ref 0.0–0.2)

## 2023-05-18 LAB — BASIC METABOLIC PANEL
Anion gap: 10 (ref 5–15)
BUN: 12 mg/dL (ref 6–20)
CO2: 27 mmol/L (ref 22–32)
Calcium: 10 mg/dL (ref 8.9–10.3)
Chloride: 103 mmol/L (ref 98–111)
Creatinine, Ser: 0.82 mg/dL (ref 0.61–1.24)
GFR, Estimated: >60 mL/min (ref >60)
Glucose, Bld: 106 mg/dL — ABNORMAL HIGH (ref 70–99)
Potassium: 3.5 mmol/L (ref 3.5–5.1)
Sodium: 140 mmol/L (ref 135–145)

## 2023-05-18 NOTE — ED Notes (Signed)
Discharge paperwork given and verbally understood. 

## 2023-05-18 NOTE — ED Triage Notes (Signed)
Patient here POV from Home.  Endorses Left Foot Abscess/Redness that began Tuesday. Began to worsen in Redness and has become colder since as well. No Known Fevers. No N/V/D.   NAD Noted during Triage. A&Ox4. Gcs 15. Ambulatory.

## 2023-05-18 NOTE — Discharge Instructions (Signed)
Continue to take the antibiotic as prescribed.  Return for rapid spreading redness, fever.

## 2023-05-18 NOTE — ED Provider Notes (Signed)
[facility]ENC[facility] AT [facility] Provider Note   CSN: [id number] Arrival date & time: 05/18/23  1755     History  Chief Complaint  Patient presents with   Foot Pain    Javier Greene is a 22 y.o. male.  22 yo M with a chief complaints of pain to his left foot.  The patient states that he thinks he was bit by an insect.  This happened few days ago.  Since then has become more red and swollen he had been seen in urgent care and they told him if it got more purple then he should be seen in the emergency department setting.  He was started on Augmentin.  Feels like its gotten mildly worse since then.  No obvious trauma.   Foot Pain       Home Medications Prior to Admission medications   Medication Sig Start Date End Date Taking? Authorizing Provider  cetirizine (ZYRTEC) 10 MG tablet Take 1 tablet (10 mg total) by mouth daily. 09/15/14 11/25/22  Estelle June, NP  EPINEPHrine (EPIPEN) 0.3 mg/0.3 mL DEVI Inject 0.3 mLs (0.3 mg total) into the muscle once. Patient not taking: Reported on 08/16/2022 08/30/12   Preston Fleeting, MD  fluticasone [facility]) 50 MCG/ACT nasal spray Place 1 spray into both nostrils daily. 09/15/14 11/25/22  Estelle June, NP  levocetirizine (XYZAL) 5 MG tablet Take 5 mg by mouth every evening. Patient not taking: Reported on 08/16/2022 05/06/19   [provider]  methylphenidate (METADATE ER) 10 MG ER tablet Take 1 tablet (10 mg total) by mouth every morning. Patient not taking: Reported on 08/16/2022 12/27/12   Preston Fleeting, MD  pantoprazole (PROTONIX) 40 MG tablet Take 1 tablet (40 mg total) by mouth daily. 08/18/22   Pyrtle, Carie Caddy, MD      Allergies    Latex, Leguminosae [peanut oil], Shellfish-derived products, Other, Shellfish allergy, Egg-derived products, and Peanut (diagnostic)    Review of Systems   Review of Systems  Physical Exam Updated Vital Signs BP 116/67 (BP Location: Right Arm)   Pulse 71   Temp 98.8 F (37.1  C) (Oral)   Resp 18   Ht 5\' 11"  (1.803 m)   Wt 83.9 kg   SpO2 100%   BMI 25.80 kg/m  Physical Exam Vitals and nursing note reviewed.  Constitutional:      Appearance: He is well-developed.  HENT:     Head: Normocephalic and atraumatic.  Eyes:     Pupils: Pupils are equal, round, and reactive to light.  Neck:     Vascular: No JVD.  Cardiovascular:     Rate and Rhythm: Normal rate and regular rhythm.     Heart sounds: No murmur heard.    No friction rub. No gallop.  Pulmonary:     Effort: No respiratory distress.     Breath sounds: No wheezing.  Abdominal:     General: There is no distension.     Tenderness: There is no abdominal tenderness. There is no guarding or rebound.  Musculoskeletal:        General: Normal range of motion.     Cervical back: Normal range of motion and neck supple.       Feet:     Comments: Area of purplish discoloration to the base of the left great toe.  No significant pain or induration or fluctuance.  There is also a small area to the plantar aspect of the foot about the  MTP.  Pulse motor and sensation intact.  Skin:    Coloration: Skin is not pale.     Findings: No rash.  Neurological:     Mental Status: He is alert and oriented to person, place, and time.  Psychiatric:        Behavior: Behavior normal.     ED Results / Procedures / Treatments   Labs (all labs ordered are listed, but only abnormal results are displayed) Labs Reviewed  BASIC METABOLIC PANEL - Abnormal; Notable for the following components:      Result Value   Glucose, Bld 106 (*)    All other components within normal limits  CBC    EKG None  Radiology DG Foot Complete Left  Result Date: 05/18/2023 CLINICAL DATA:  Left Foot Pain/Infection EXAM: LEFT FOOT - COMPLETE 3+ VIEW COMPARISON:  None Available. FINDINGS: No cortical erosion or destruction. There is no evidence of fracture or dislocation. Vague transverse 2 mm lucency noted on the frontal view along the base  of the fifth metatarsal likely artifact or nutrient vessel with no findings on the other two views. There is no evidence of arthropathy or other focal bone abnormality. Subcutaneus soft tissue edema. IMPRESSION: No acute displaced fracture or dislocation. No radiographic findings to suggest osteomyelitis. Limited evaluation due to overlapping osseous structures and overlying soft tissues. If high clinical concern, please consider MRI for further evaluation (with intravenous contrast if GFR greater than 30). Electronically Signed   By: Tish Frederickson M.D.   On: 05/18/2023 18:59    Procedures Procedures    Medications Ordered in ED Medications - No data to display  ED Course/ Medical Decision Making/ A&P                             Medical Decision Making Amount and/or Complexity of Data Reviewed Labs: ordered. Radiology: ordered.   22 yo M with a chief complaints of a possible bite to his left foot.  Not obviously cellulitic by exam.  Looks more bruised to me.  Plain film independently interpreted by me without fracture.  Other possibility of a snakebite, not rapidly changing in 48 hours do not feel further intervention in the ER is warranted at this time.  Basic blood work obtained as well, no thrombocytopenia no leukocytosis.  Will discharge home.  Continue antibiotics.  PCP follow-up.  8:46 PM:  I have discussed the diagnosis/risks/treatment options with the patient and family.  Evaluation and diagnostic testing in the emergency department does not suggest an emergent condition requiring admission or immediate intervention beyond what has been performed at this time.  They will follow up with PCP. We also discussed returning to the ED immediately if new or worsening sx occur. We discussed the sx which are most concerning (e.g., sudden worsening pain, fever, inability to tolerate by mouth, rapid spreading redness) that necessitate immediate return. Medications administered to the patient  during their visit and any new prescriptions provided to the patient are listed below.  Medications given during this visit Medications - No data to display   The patient appears reasonably screen and/or stabilized for discharge and I doubt any other medical condition or other [facility] requiring further screening, evaluation, or treatment in the ED at this time prior to discharge.          Final Clinical Impression(s) / ED Diagnoses Final diagnoses:  Insect bite of left foot, initial encounter    Rx /  DC Orders ED Discharge Orders     None         Melene Plan, [place] 05/18/23 2046

## 2023-07-21 ENCOUNTER — Other Ambulatory Visit: Payer: Self-pay | Admitting: Internal Medicine

## 2023-07-21 DIAGNOSIS — R1319 Other dysphagia: Secondary | ICD-10-CM

## 2023-07-21 DIAGNOSIS — K219 Gastro-esophageal reflux disease without esophagitis: Secondary | ICD-10-CM

## 2024-03-01 ENCOUNTER — Other Ambulatory Visit: Payer: Self-pay

## 2024-03-01 ENCOUNTER — Ambulatory Visit
Admission: RE | Admit: 2024-03-01 | Discharge: 2024-03-01 | Disposition: A | Discharge status: Home / Self Care | Source: Ambulatory Visit | Attending: Family Medicine | Admitting: Family Medicine

## 2024-03-01 VITALS — BP 121/75 | HR 70 | Temp 97.6°F | Resp 16

## 2024-03-01 DIAGNOSIS — M5442 Lumbago with sciatica, left side: Secondary | ICD-10-CM | POA: Diagnosis not present

## 2024-03-01 MED ORDER — CYCLOBENZAPRINE HCL 5 MG PO TABS: 5.0000 mg | ORAL_TABLET | Freq: Two times a day (BID) | ORAL | 0 refills | Status: DC | PRN

## 2024-03-01 MED ORDER — PREDNISONE 5 MG/5ML PO SOLN: 20.0000 mg | Freq: Two times a day (BID) | ORAL | 0 refills | Status: AC

## 2024-03-01 MED ORDER — PREDNISONE 5 MG/5ML PO SOLN: 20.0000 mg | Freq: Two times a day (BID) | ORAL | 0 refills | Status: DC

## 2024-03-01 NOTE — ED Provider Notes (Addendum)
 [facility]    CSN: [id number] Arrival date & time: 03/01/24  1728      History   Chief Complaint Chief Complaint  Patient presents with   Back Pain    HPI Javier Greene is a 23 y.o. male.   Patient here for evaluation of low back pain x 2 days. Patient reports no injury. He reports pain started in the mid lower back and as the day progressed the pain began to radiated into the left lower leg. Today the pain worsened while patient was at work. No history of recurrent back pain. Patient works for several hours daily on his feet and lifting heavy objects throughout the day. Denies weakness, numbness, or changes in urinary or bowel habits. Took one dose ibuprofen earlier with minimal relief.   Past Medical History:  Diagnosis Date   ADHD (attention deficit hyperactivity disorder)    Allergy    Asperger syndrome    Asthma    severe when younger, now Albuterol MDI prn, no controllers   Auditory processing disorder    Blunt head trauma 06/26/2000   Larey Seat forward and hit head on floor, seen in ER, CT SCAN normal   Developmental speech or language disorder    Environmental allergies 07/09/2012   Eosinophilic esophagitis    GERD (gastroesophageal reflux disease) 01/20/2000   Zantac, Prilosec. Resolved.   Learning disability    Neonatal jaundice    Peak bili 11.4   Positional plagiocephaly    helmet, [facility]    Patient Active Problem List   Diagnosis Date Noted   Autism spectrum disorder 09/02/2016   Central auditory processing disorder 09/02/2016   Other seasonal allergic rhinitis 09/15/2014   Environmental allergies 07/09/2012   ADHD (attention deficit hyperactivity disorder) 07/09/2012   Asthma 07/09/2012   Learning disability    Developmental speech or language disorder     Past Surgical History:  Procedure Laterality Date   CIRCUMCISION     MANDIBLE FRACTURE SURGERY     UPPER GASTROINTESTINAL ENDOSCOPY         Home Medications    Prior to  Admission medications   Medication Sig Start Date End Date Taking? Authorizing Provider  cetirizine (ZYRTEC) 10 MG tablet Take 1 tablet (10 mg total) by mouth daily. 09/15/14 11/25/22  Estelle June, NP  cyclobenzaprine (FLEXERIL) 5 MG tablet Take 1 tablet (5 mg total) by mouth 2 (two) times daily as needed for muscle spasms. 03/01/24   Bing Neighbors, NP  EPINEPHrine (EPIPEN) 0.3 mg/0.3 mL DEVI Inject 0.3 mLs (0.3 mg total) into the muscle once. Patient not taking: Reported on 08/16/2022 08/30/12   Preston Fleeting, MD  fluticasone [facility]) 50 MCG/ACT nasal spray Place 1 spray into both nostrils daily. 09/15/14 11/25/22  Estelle June, NP  levocetirizine (XYZAL) 5 MG tablet Take 5 mg by mouth every evening. Patient not taking: Reported on 08/16/2022 05/06/19   [provider]  methylphenidate (METADATE ER) 10 MG ER tablet Take 1 tablet (10 mg total) by mouth every morning. Patient not taking: Reported on 08/16/2022 12/27/12   Preston Fleeting, MD  pantoprazole (PROTONIX) 40 MG tablet TAKE ONE TABLET BY MOUTH EVERY DAY 07/21/23   Pyrtle, Carie Caddy, MD  predniSONE 5 MG/5ML solution Take 20 mLs (20 mg total) by mouth 2 (two) times daily with a meal for 5 days. 03/01/24 03/06/24  Bing Neighbors, NP    Family History Family History  Problem Relation Age of Onset  Lupus Father    Diabetes Maternal Uncle    Colon cancer Maternal Grandmother    Colon polyps Maternal Grandmother    Diabetes Maternal Grandfather    Colon polyps Paternal Grandmother    Diabetes Paternal Grandmother    Diabetes Paternal Grandfather    Esophageal cancer Neg Hx    Stomach cancer Neg Hx    Rectal cancer Neg Hx     Social History Social History   Tobacco Use   Smoking status: Never  Vaping Use   Vaping status: Never Used  Substance Use Topics   Alcohol use: Not Currently    Comment: rarely   Drug use: Never     Allergies   Latex, Leguminosae [peanut oil], Shellfish-derived products, Other, Shellfish  allergy, Egg-derived products, and Peanut (diagnostic)   Review of Systems Review of Systems  Musculoskeletal:  Positive for back pain.     Physical Exam Triage Vital Signs ED Triage Vitals  Encounter Vitals Group     BP 03/01/24 1742 121/75     Systolic BP Percentile --      Diastolic BP Percentile --      Pulse Rate 03/01/24 1742 70     Resp 03/01/24 1742 16     Temp 03/01/24 1742 97.6 F (36.4 C)     Temp src --      SpO2 03/01/24 1742 100 %     Weight --      Height --      Head Circumference --      Peak Flow --      Pain Score 03/01/24 1744 2     Pain Loc --      Pain Education --      Exclude from Growth Chart --    No data found.  Updated Vital Signs BP 121/75   Pulse 70   Temp 97.6 F (36.4 C)   Resp 16   SpO2 100%   Visual Acuity Right Eye Distance:   Left Eye Distance:   Bilateral Distance:    Right Eye Near:   Left Eye Near:    Bilateral Near:     Physical Exam Vitals reviewed.  Constitutional:      Appearance: Normal appearance.  HENT:     Head: Normocephalic and atraumatic.  Cardiovascular:     Rate and Rhythm: Normal rate and regular rhythm.  Pulmonary:     Effort: Pulmonary effort is normal.     Breath sounds: Normal breath sounds.  Musculoskeletal:     Lumbar back: Tenderness present. Decreased range of motion. Positive left straight leg raise test. Negative right straight leg raise test.  Neurological:     Mental Status: He is alert.      UC Treatments / Results  Labs (all labs ordered are listed, but only abnormal results are displayed) Labs Reviewed - No data to display  EKG   Radiology No results found.  Procedures Procedures (including critical care time)  Medications Ordered in UC Medications - No data to display  Initial Impression / Assessment and Plan / UC Course  I have reviewed the triage vital signs and the nursing notes.  Pertinent labs & imaging results that were available during my care of the  patient were reviewed by me and considered in my medical decision making (see chart for details).   Acute Left Sided Low Back Pain, with left sided sciatica. Treating with prednisone (patient requested liquid as he has difficulty swallowing pills), and cyclobenzaprine.  Recommended  applying heat to lower back to improve pain. Return precaution given if symptom do not improve. Final Clinical Impressions(s) / UC Diagnoses   Final diagnoses:  Acute left-sided low back pain with left-sided sciatica     Discharge Instructions      Take prednisone this will decrease the inflammation in your lower back which is causing the sciatic pain to shoot down your leg.  Will take the prednisone twice daily for 5 days.  I have also prescribed you cyclobenzaprine you can take 1 tablet up to twice daily as needed for pain.  If you are working or driving do not take medication limit to only bedtime use.  Symptoms should gradually improve over the next few days.  Also recommend applying heat to your lower back this can also help improve the back pain.     ED Prescriptions     Medication Sig Dispense Auth. Provider   cyclobenzaprine (FLEXERIL) 5 MG tablet  (Status: Discontinued) Take 1 tablet (5 mg total) by mouth 2 (two) times daily as needed for muscle spasms. 14 tablet Bing Neighbors, NP   predniSONE 5 MG/5ML solution  (Status: Discontinued) Take 20 mLs (20 mg total) by mouth 2 (two) times daily with a meal for 5 days. 200 mL Bing Neighbors, NP   cyclobenzaprine (FLEXERIL) 5 MG tablet Take 1 tablet (5 mg total) by mouth 2 (two) times daily as needed for muscle spasms. 14 tablet Bing Neighbors, NP   predniSONE 5 MG/5ML solution Take 20 mLs (20 mg total) by mouth 2 (two) times daily with a meal for 5 days. 200 mL Bing Neighbors, NP      PDMP not reviewed this encounter.   Bing Neighbors, NP 03/02/24 1742    Bing Neighbors, NP 03/02/24 1745

## 2024-03-01 NOTE — ED Triage Notes (Signed)
 Low back pain since yesterday evening. Has taken ibuprofen.

## 2024-03-01 NOTE — Discharge Instructions (Signed)
 Take prednisone this will decrease the inflammation in your lower back which is causing the sciatic pain to shoot down your leg.  Will take the prednisone twice daily for 5 days.  I have also prescribed you cyclobenzaprine you can take 1 tablet up to twice daily as needed for pain.  If you are working or driving do not take medication limit to only bedtime use.  Symptoms should gradually improve over the next few days.  Also recommend applying heat to your lower back this can also help improve the back pain.

## 2024-03-02 ENCOUNTER — Telehealth: Payer: Self-pay

## 2024-03-02 NOTE — Telephone Encounter (Signed)
 [facility] called, does not have liquid prednisone, has tablets. Dr. Delton See approved switching to tablets for equivalent dose. Pharmacy notified.

## 2024-05-06 ENCOUNTER — Other Ambulatory Visit: Payer: Self-pay

## 2024-05-06 ENCOUNTER — Ambulatory Visit
Admission: RE | Admit: 2024-05-06 | Discharge: 2024-05-06 | Disposition: A | Discharge status: Home / Self Care | Source: Ambulatory Visit | Attending: Family Medicine | Admitting: Family Medicine

## 2024-05-06 VITALS — BP 128/79 | HR 90 | Temp 98.3°F | Resp 16

## 2024-05-06 DIAGNOSIS — J029 Acute pharyngitis, unspecified: Secondary | ICD-10-CM

## 2024-05-06 LAB — POCT RAPID STREP A (OFFICE): Rapid Strep A Screen: NEGATIVE

## 2024-05-06 NOTE — ED Provider Notes (Signed)
[facility]    CSN: [id number] Arrival date & time: 05/06/24  1527      History   Chief Complaint Chief Complaint  Patient presents with   Sore Throat    HPI Javier Greene is a 23 y.o. male.   HPI  Patient woke up with a mild sore throat this morning.  He went to work as usual.  As the day progressed he had more severe sore throat.  He is here to have this evaluated.  No known exposure to illness.  He states he is otherwise well  Past Medical History:  Diagnosis Date   ADHD (attention deficit hyperactivity disorder)    Allergy    Asperger syndrome    Asthma    severe when younger, now Albuterol MDI prn, no controllers   Auditory processing disorder    Blunt head trauma 06/26/2000   Javier Greene forward and hit head on floor, seen in ER, CT SCAN normal   Developmental speech or language disorder    Environmental allergies 07/09/2012   Eosinophilic esophagitis    GERD (gastroesophageal reflux disease) 01/20/2000   Zantac, Prilosec. Resolved.   Learning disability    Neonatal jaundice    Peak bili 11.4   Positional plagiocephaly    helmet, [facility]    Patient Active Problem List   Diagnosis Date Noted   Autism spectrum disorder 09/02/2016   Central auditory processing disorder 09/02/2016   Other seasonal allergic rhinitis 09/15/2014   Environmental allergies 07/09/2012   ADHD (attention deficit hyperactivity disorder) 07/09/2012   Asthma 07/09/2012   Learning disability    Developmental speech or language disorder     Past Surgical History:  Procedure Laterality Date   CIRCUMCISION     MANDIBLE FRACTURE SURGERY     UPPER GASTROINTESTINAL ENDOSCOPY         Home Medications    Prior to Admission medications   Medication Sig Start Date End Date Taking? Authorizing Provider  EPINEPHrine  (EPIPEN ) 0.3 mg/0.3 mL DEVI Inject 0.3 mLs (0.3 mg total) into the muscle once. Patient not taking: Reported on 08/16/2022 08/30/12   Carlotta Chew, MD  fluticasone   (FLONASE ) 50 MCG/ACT nasal spray Place 1 spray into both nostrils daily. 09/15/14 11/25/22  Rayann Cage, NP  levocetirizine (XYZAL) 5 MG tablet Take 5 mg by mouth every evening. Patient not taking: Reported on 08/16/2022 05/06/19   [provider]  pantoprazole  (PROTONIX ) 40 MG tablet TAKE ONE TABLET BY MOUTH EVERY DAY 07/21/23   Pyrtle, Amber Bail, MD    Family History Family History  Problem Relation Age of Onset   Lupus Father    Diabetes Maternal Uncle    Colon cancer Maternal Grandmother    Colon polyps Maternal Grandmother    Diabetes Maternal Grandfather    Colon polyps Paternal Grandmother    Diabetes Paternal Grandmother    Diabetes Paternal Grandfather    Esophageal cancer Neg Hx    Stomach cancer Neg Hx    Rectal cancer Neg Hx     Social History Social History   Tobacco Use   Smoking status: Never  Vaping Use   Vaping status: Never Used  Substance Use Topics   Alcohol use: Not Currently    Comment: rarely   Drug use: Never     Allergies   Latex, Leguminosae [peanut oil], Shellfish-derived products, Other, Shellfish allergy, Egg-derived products, and Peanut (diagnostic)   Review of Systems Review of Systems See HPI  Physical Exam Triage Vital Signs  ED Triage Vitals  Encounter Vitals Group     BP 05/06/24 1531 128/79     Systolic BP Percentile --      Diastolic BP Percentile --      Pulse Rate 05/06/24 1531 90     Resp 05/06/24 1531 16     Temp 05/06/24 1531 98.3 F (36.8 C)     Temp src --      SpO2 05/06/24 1531 99 %     Weight --      Height --      Head Circumference --      Peak Flow --      Pain Score 05/06/24 1533 5     Pain Loc --      Pain Education --      Exclude from Growth Chart --    No data found.  Updated Vital Signs BP 128/79   Pulse 90   Temp 98.3 F (36.8 C)   Resp 16   SpO2 99%   Physical Exam Constitutional:      General: He is not in acute distress.    Appearance: He is well-developed.  HENT:     Head:  Normocephalic and atraumatic.     Right Ear: Tympanic membrane and ear canal normal.     Left Ear: Tympanic membrane and ear canal normal.     Nose: No congestion.     Mouth/Throat:     Mouth: Mucous membranes are moist.     Pharynx: Pharyngeal swelling and posterior oropharyngeal erythema present.     Tonsils: No tonsillar exudate. 1+ on the right. 1+ on the left.  Eyes:     Conjunctiva/sclera: Conjunctivae normal.     Pupils: Pupils are equal, round, and reactive to light.  Cardiovascular:     Rate and Rhythm: Normal rate and regular rhythm.  Pulmonary:     Effort: Pulmonary effort is normal. No respiratory distress.     Breath sounds: Normal breath sounds.  Abdominal:     General: There is no distension.     Palpations: Abdomen is soft.  Musculoskeletal:        General: Normal range of motion.     Cervical back: Normal range of motion.  Lymphadenopathy:     Cervical: No cervical adenopathy.  Skin:    General: Skin is warm and dry.  Neurological:     Mental Status: He is alert.      UC Treatments / Results  Labs (all labs ordered are listed, but only abnormal results are displayed) Labs Reviewed  POCT RAPID STREP A (OFFICE)    EKG   Radiology No results found.  Procedures Procedures (including critical care time)  Medications Ordered in UC Medications - No data to display  Initial Impression / Assessment and Plan / UC Course  I have reviewed the triage vital signs and the nursing notes.  Pertinent labs & imaging results that were available during my care of the patient were reviewed by me and considered in my medical decision making (see chart for details).     Reviewed home treatment of viral illness Final Clinical Impressions(s) / UC Diagnoses   Final diagnoses:  Viral pharyngitis     Discharge Instructions      Take Tylenol or ibuprofen as needed for pain and fever Make sure you are drinking lots of fluids   ED Prescriptions   None     PDMP not reviewed this encounter.   Stephany Ehrich, MD 05/06/24  1644  

## 2024-05-06 NOTE — Discharge Instructions (Signed)
 Take Tylenol or ibuprofen as needed for pain and fever Make sure you are drinking lots of fluids

## 2024-05-06 NOTE — ED Triage Notes (Addendum)
 Since this morning has had sore throat, inflamed tonsils, cough, fever. Has not had otc meds.

## 2024-05-10 ENCOUNTER — Ambulatory Visit
Admission: RE | Admit: 2024-05-10 | Discharge: 2024-05-10 | Disposition: A | Discharge status: Home / Self Care | Source: Ambulatory Visit | Attending: Family Medicine | Admitting: Family Medicine

## 2024-05-10 VITALS — BP 122/80 | HR 84 | Temp 99.1°F | Resp 16

## 2024-05-10 DIAGNOSIS — J069 Acute upper respiratory infection, unspecified: Secondary | ICD-10-CM

## 2024-05-10 DIAGNOSIS — J3089 Other allergic rhinitis: Secondary | ICD-10-CM | POA: Diagnosis not present

## 2024-05-10 MED ORDER — AZITHROMYCIN 250 MG PO TABS: ORAL_TABLET | ORAL | 0 refills | Status: DC

## 2024-05-10 MED ORDER — PREDNISONE 20 MG PO TABS: ORAL_TABLET | ORAL | 0 refills | Status: DC

## 2024-05-10 NOTE — ED Provider Notes (Signed)
 [facility]    CSN: [id number] Arrival date & time: 05/10/24  1121      History   Chief Complaint Chief Complaint  Patient presents with   Sore Throat    Sore throat, cough, fever     HPI Javier Greene is a 23 y.o. male.   Four days ago patient developed sore throat followed the next day by cough and sinus congestion.  His symptoms have become worse and he is now having fever.  He denies pleuritic pain and shortness of breath.  The history is provided by the patient.    Past Medical History:  Diagnosis Date   ADHD (attention deficit hyperactivity disorder)    Allergy    Asperger syndrome    Asthma    severe when younger, now Albuterol MDI prn, no controllers   Auditory processing disorder    Blunt head trauma 06/26/2000   Javier Greene forward and hit head on floor, seen in ER, CT SCAN normal   Developmental speech or language disorder    Environmental allergies 07/09/2012   Eosinophilic esophagitis    GERD (gastroesophageal reflux disease) 01/20/2000   Zantac, Prilosec. Resolved.   Learning disability    Neonatal jaundice    Peak bili 11.4   Positional plagiocephaly    helmet, [facility]    Patient Active Problem List   Diagnosis Date Noted   Autism spectrum disorder 09/02/2016   Central auditory processing disorder 09/02/2016   Other seasonal allergic rhinitis 09/15/2014   Environmental allergies 07/09/2012   ADHD (attention deficit hyperactivity disorder) 07/09/2012   Asthma 07/09/2012   Learning disability    Developmental speech or language disorder     Past Surgical History:  Procedure Laterality Date   CIRCUMCISION     MANDIBLE FRACTURE SURGERY     UPPER GASTROINTESTINAL ENDOSCOPY         Home Medications    Prior to Admission medications   Medication Sig Start Date End Date Taking? Authorizing Provider  azithromycin (ZITHROMAX Z-PAK) 250 MG tablet Take 2 tabs today; then begin one tab once daily for 4 more days. 05/10/24  Yes Leon Rajas, MD  predniSONE  (DELTASONE ) 20 MG tablet Take one tab by mouth twice daily for 4 days, then one daily for 3 days. Take with food. 05/10/24  Yes Leon Rajas, MD  EPINEPHrine  (EPIPEN ) 0.3 mg/0.3 mL DEVI Inject 0.3 mLs (0.3 mg total) into the muscle once. Patient not taking: Reported on 08/16/2022 08/30/12   Carlotta Chew, MD  fluticasone  (FLONASE ) 50 MCG/ACT nasal spray Place 1 spray into both nostrils daily. 09/15/14 11/25/22  Rayann Cage, NP  levocetirizine (XYZAL) 5 MG tablet Take 5 mg by mouth every evening. Patient not taking: Reported on 08/16/2022 05/06/19   [provider]  pantoprazole  (PROTONIX ) 40 MG tablet TAKE ONE TABLET BY MOUTH EVERY DAY 07/21/23   Pyrtle, Amber Bail, MD    Family History Family History  Problem Relation Age of Onset   Lupus Father    Diabetes Maternal Uncle    Colon cancer Maternal Grandmother    Colon polyps Maternal Grandmother    Diabetes Maternal Grandfather    Colon polyps Paternal Grandmother    Diabetes Paternal Grandmother    Diabetes Paternal Grandfather    Esophageal cancer Neg Hx    Stomach cancer Neg Hx    Rectal cancer Neg Hx     Social History Social History   Tobacco Use   Smoking status: Never  Vaping  Use   Vaping status: Never Used  Substance Use Topics   Alcohol use: Not Currently    Comment: rarely   Drug use: Never     Allergies   Latex, Leguminosae [peanut oil], Shellfish-derived products, Other, Shellfish allergy, Egg-derived products, and Peanut (diagnostic)   Review of Systems Review of Systems + sore throat + cough No pleuritic pain No wheezing + nasal congestion + post-nasal drainage ? sinus pain/pressure No itchy/red eyes No earache No hemoptysis No SOB + fever No nausea No vomiting No abdominal pain No diarrhea No urinary symptoms No skin rash + fatigue No myalgias No headache Used OTC meds (ibuprofen, Tylenol, Mucinex, Zyrtec , Flonase ) without relief   Physical Exam Triage  Vital Signs ED Triage Vitals [05/10/24 1135]  Encounter Vitals Group     BP 122/80     Systolic BP Percentile      Diastolic BP Percentile      Pulse Rate 84     Resp 16     Temp 99.1 F (37.3 C)     Temp Source Oral     SpO2 95 %     Weight      Height      Head Circumference      Peak Flow      Pain Score 3     Pain Loc      Pain Education      Exclude from Growth Chart    No data found.  Updated Vital Signs BP 122/80 (BP Location: Right Arm)   Pulse 84   Temp 99.1 F (37.3 C) (Oral)   Resp 16   SpO2 95%   Visual Acuity Right Eye Distance:   Left Eye Distance:   Bilateral Distance:    Right Eye Near:   Left Eye Near:    Bilateral Near:     Physical Exam Nursing notes and Vital Signs reviewed. Appearance:  Patient appears stated age, and in no acute distress Eyes:  Pupils are equal, round, and reactive to light and accomodation.  Extraocular movement is intact.  Conjunctivae are not inflamed  Ears:  Canals normal.  Tympanic membranes normal.  Nose:  Congested turbinates.  Mild maxillary sinus tenderness is present.  Pharynx:  Normal Neck:  Supple.  Mildly enlarged lateral nodes are present, tender to palpation on the left.   Lungs:  Clear to auscultation.  Breath sounds are equal.  Moving air well. Heart:  Regular rate and rhythm without murmurs, rubs, or gallops.  Abdomen:  Nontender without masses or hepatosplenomegaly.  Bowel sounds are present.  No CVA or flank tenderness.  Extremities:  No edema.  Skin:  No rash present.   UC Treatments / Results  Labs (all labs ordered are listed, but only abnormal results are displayed) Labs Reviewed - No data to display  EKG   Radiology No results found.  Procedures Procedures (including critical care time)  Medications Ordered in UC Medications - No data to display  Initial Impression / Assessment and Plan / UC Course  I have reviewed the triage vital signs and the nursing notes.  Pertinent labs &  imaging results that were available during my care of the patient were reviewed by me and considered in my medical decision making (see chart for details).    Begin prednisone  burst/taper.  Because of his pre-existing sinus congestion now worse with his viral URI will begin a Z-pack. Followup with Family Doctor if not improved in one week.   Final Clinical  Impressions(s) / UC Diagnoses   Final diagnoses:  Viral URI with cough  Perennial allergic rhinitis     Discharge Instructions      Take plain guaifenesin syrup (such as Diabetic Tussin Chest Congestion) every 4 hours with plenty of water, for cough and congestion.  May add Pseudoephedrine (30mg , one or two every 4 to 6 hours) for sinus congestion.  Get adequate rest.   May use Afrin nasal spray (or generic oxymetazoline) each morning for about 5 days and then discontinue.  Also recommend using saline nasal spray several times daily and saline nasal irrigation (AYR is a common brand).  Use Flonase  nasal spray each morning after using Afrin nasal spray and saline nasal irrigation. Try warm salt water gargles for sore throat.  Stop all antihistamines (Zyrtec , Allegra, etc) for now, and other non-prescription cough/cold preparations. May take Delsym Cough Suppressant ("12 Hour Cough Relief") at bedtime for nighttime cough.     ED Prescriptions     Medication Sig Dispense Auth. Provider   predniSONE  (DELTASONE ) 20 MG tablet Take one tab by mouth twice daily for 4 days, then one daily for 3 days. Take with food. 11 tablet Leon Rajas, MD   azithromycin (ZITHROMAX Z-PAK) 250 MG tablet Take 2 tabs today; then begin one tab once daily for 4 more days. 6 tablet Leon Rajas, MD         Leon Rajas, MD 05/11/24 [phone]

## 2024-05-10 NOTE — Discharge Instructions (Signed)
 Take plain guaifenesin syrup (such as Diabetic Tussin Chest Congestion) every 4 hours with plenty of water, for cough and congestion.  May add Pseudoephedrine (30mg , one or two every 4 to 6 hours) for sinus congestion.  Get adequate rest.   May use Afrin nasal spray (or generic oxymetazoline) each morning for about 5 days and then discontinue.  Also recommend using saline nasal spray several times daily and saline nasal irrigation (AYR is a common brand).  Use Flonase  nasal spray each morning after using Afrin nasal spray and saline nasal irrigation. Try warm salt water gargles for sore throat.  Stop all antihistamines (Zyrtec , Allegra, etc) for now, and other non-prescription cough/cold preparations. May take Delsym Cough Suppressant ("12 Hour Cough Relief") at bedtime for nighttime cough.

## 2024-05-10 NOTE — ED Triage Notes (Signed)
 Pt presents with cough, core throat, fever xs 4-5 days. States symptoms have not resolved since being seen Monday. State OTC medications are not giving any relief.

## 2024-08-26 ENCOUNTER — Other Ambulatory Visit: Payer: Self-pay | Admitting: Internal Medicine

## 2024-08-26 DIAGNOSIS — R1319 Other dysphagia: Secondary | ICD-10-CM

## 2024-08-26 DIAGNOSIS — K219 Gastro-esophageal reflux disease without esophagitis: Secondary | ICD-10-CM

## 2024-09-06 ENCOUNTER — Telehealth: Payer: Self-pay | Admitting: Internal Medicine

## 2024-09-06 DIAGNOSIS — K219 Gastro-esophageal reflux disease without esophagitis: Secondary | ICD-10-CM

## 2024-09-06 DIAGNOSIS — R1319 Other dysphagia: Secondary | ICD-10-CM

## 2024-09-06 MED ORDER — PANTOPRAZOLE SODIUM 40 MG PO TBEC
40.0000 mg | DELAYED_RELEASE_TABLET | Freq: Every day | ORAL | 0 refills | Status: DC
Start: 2024-09-06 — End: 2024-10-31

## 2024-09-06 NOTE — Telephone Encounter (Signed)
 Inbound call from patient requesting a refill on his pantoprazole . Patient was schedule for November 14 at 3 PM. Please advise.

## 2024-09-06 NOTE — Telephone Encounter (Signed)
Rx for pantoprazole sent to pharmacy. 

## 2024-10-31 ENCOUNTER — Ambulatory Visit: Admitting: Gastroenterology

## 2024-10-31 ENCOUNTER — Encounter: Payer: Self-pay | Admitting: Gastroenterology

## 2024-10-31 VITALS — BP 110/70 | HR 72 | Ht 71.0 in | Wt 196.0 lb

## 2024-10-31 DIAGNOSIS — R1319 Other dysphagia: Secondary | ICD-10-CM

## 2024-10-31 DIAGNOSIS — K200 Eosinophilic esophagitis: Secondary | ICD-10-CM | POA: Diagnosis not present

## 2024-10-31 DIAGNOSIS — K219 Gastro-esophageal reflux disease without esophagitis: Secondary | ICD-10-CM

## 2024-10-31 MED ORDER — PANTOPRAZOLE SODIUM 40 MG PO TBEC
40.0000 mg | DELAYED_RELEASE_TABLET | Freq: Every day | ORAL | 3 refills | Status: AC
Start: 2024-10-31 — End: ?

## 2024-10-31 NOTE — Progress Notes (Unsigned)
 Chief Complaint: Medication refill Primary GI MD: Dr. Albertus  HPI: 23 year old male history of EOE presents for medication refill  Patient last seen by Dr. Albertus in 2023 for dysphagia, nausea, vomiting.  He underwent EGD 07/2022 as below in which EOE was identified with pathology being too many to count.  He was put on fluticasone  swallowed and pantoprazole  with improvement in symptoms but follow-up EGD 11/2022 showed persistent eosinophils too numerous to count.  Patient was referred to an allergist (Dr. Altamease) in which she was found to have an allergy to peanuts and seafood and he was placed on Dupixent at that time.  He currently remains on Dupixent and pantoprazole  with complete resolution of symptoms and is here for med refill of his pantoprazole .  PREVIOUS GI WORKUP   EGD 11/2022 - Esophageal mucosal changes secondary to eosinophilic esophagitis. Much improved compared to Aug 2023.  - Benign- appearing esophageal stenosis. Dilated to 18 mm with balloon.  - Z- line irregular, 40 cm from the incisors. Biopsied.  - 1- 2 cm hiatal hernia.  - Normal stomach.  - Normal examined duodenum.  - Biopsies were taken with a cold forceps for evaluation of eosinophilic esophagitis.  Diagnosis 1. Surgical [P], irregular z line - CARDIO OXYNTIC MUCOSA (NO SQUAMOUS MUCOSA IS PRESENT IN THE SPECIMEN) WITH FOVEOLAR HYPERPLASIA AND REACTIVE EPITHELIAL CHANGE. - NEGATIVE FOR INTESTINAL METAPLASIA. 2. Surgical [P], distal esophagus - SQUAMOUS MUCOSA WITH INCREASED INTRAEPITHELIAL EOSINOPHILS (FOCALLY TOO NUMEROUS TO COUNT), SEE PART 3 3. Surgical [P], proximal esophagus - SQUAMOUS MUCOSA WITH INCREASED INTRAEPITHELIAL EOSINOPHILS (FOCALLY TOO NUMEROUS TO COUNT) CONSISTENT WITH EOSINOPHILIC ESOPHAGITIS IN THE APPROPRIATE CLINICAL/ENDOSCOPIC SETTING.  EGD 07/2022 for dysphagia, nausea, vomiting - Esophageal mucosal changes consistent with eosinophilic esophagitis. Biopsied. Effectively  dilated by scope passage.  - 2 cm hiatal hernia.  - Normal stomach.  - Normal examined duodenum.  Surgical [P], colon nos, random sites - SQUAMOUS MUCOSA WITH BASAL CELL HYPERPLASIA AND INCREASED INTRAEPITHELIAL EOSINOPHILS (TOO NUMEROUS TO COUNT) WITH MICROABSCESS FORMATION CONSISTENT WITH EOSINOPHILIC ESOPHAGITIS IN THE APPROPRIATE CLINICAL SETTING. NOTE: THERE IS FOCAL SUBMUCOSA PRESENT WHICH SHOWS EVIDENCE OF A DEGREE OF FIBROSIS SUGGESTIVE OF POSSIBLE LONGSTANDING EOSINOPHILIC ESOPHAGITIS  Past Medical History:  Diagnosis Date   ADHD (attention deficit hyperactivity disorder)    Allergy    Asperger syndrome    Asthma    severe when younger, now Albuterol MDI prn, no controllers   Auditory processing disorder    Blunt head trauma 06/26/2000   Clemens forward and hit head on floor, seen in ER, CT SCAN normal   Developmental speech or language disorder    Environmental allergies 07/09/2012   Eosinophilic esophagitis    GERD (gastroesophageal reflux disease) 01/20/2000   Zantac, Prilosec. Resolved.   Learning disability    Neonatal jaundice    Peak bili 11.4   Positional plagiocephaly    helmet, [facility]    Past Surgical History:  Procedure Laterality Date   CIRCUMCISION     MANDIBLE FRACTURE SURGERY     UPPER GASTROINTESTINAL ENDOSCOPY      Current Outpatient Medications  Medication Sig Dispense Refill   azithromycin  (ZITHROMAX  Z-PAK) 250 MG tablet Take 2 tabs today; then begin one tab once daily for 4 more days. 6 tablet 0   EPINEPHrine  (EPIPEN ) 0.3 mg/0.3 mL DEVI Inject 0.3 mLs (0.3 mg total) into the muscle once. 1 Device 1   fluticasone  (FLONASE ) 50 MCG/ACT nasal spray Place 1 spray into both nostrils daily. 16 g 12  levocetirizine (XYZAL) 5 MG tablet Take 5 mg by mouth every evening.     pantoprazole  (PROTONIX ) 40 MG tablet Take 1 tablet (40 mg total) by mouth daily. 90 tablet 0   predniSONE  (DELTASONE ) 20 MG tablet Take one tab by mouth twice daily for 4 days, then  one daily for 3 days. Take with food. (Patient not taking: Reported on 10/31/2024) 11 tablet 0   No current facility-administered medications for this visit.    Allergies as of 10/31/2024 - Review Complete 10/31/2024  Allergen Reaction Noted   Latex Swelling 07/09/2012   Leguminosae [peanut oil] Nausea And Vomiting 07/09/2012   Shellfish protein-containing drug products Itching and Hives 09/15/2014   Other Hives, Nausea And Vomiting, and Swelling 05/29/2018   Shellfish allergy Hives and Itching 09/15/2014   Egg protein-containing drug products Rash 07/09/2012   Peanut (diagnostic) Nausea And Vomiting and Rash 08/18/2022    Family History  Problem Relation Age of Onset   Lupus Father    Diabetes Maternal Uncle    Colon cancer Maternal Grandmother    Colon polyps Maternal Grandmother    Diabetes Maternal Grandfather    Colon polyps Paternal Grandmother    Diabetes Paternal Grandmother    Diabetes Paternal Grandfather    Esophageal cancer Neg Hx    Stomach cancer Neg Hx    Rectal cancer Neg Hx     Social History   Socioeconomic History   Marital status: Single    Spouse name: Not on file   Number of children: Not on file   Years of education: Not on file   Highest education level: Not on file  Occupational History   Not on file  Tobacco Use   Smoking status: Never   Smokeless tobacco: Not on file  Vaping Use   Vaping status: Never Used  Substance and Sexual Activity   Alcohol use: Not Currently    Comment: rarely   Drug use: Never   Sexual activity: Not on file  Other Topics Concern   Not on file  Social History Narrative   Lives with parents and older sibling   No pets   No tobacco   Home schooled   Dx with Asberger's per mom   Social Drivers of Health   Financial Resource Strain: Not on file  Food Insecurity: No Food Insecurity (04/09/2021)   Received from [facility]   Hunger Vital Sign    Within the past 12 months, you worried that your food would  run out before you got the money to buy more.: Never true    Within the past 12 months, the food you bought just didn't last and you didn't have money to get more.: Never true  Transportation Needs: Not on file  Physical Activity: Not on file  Stress: Not on file  Social Connections: Unknown (05/02/2022)   Received from [facility]   Social Network    Social Network: Not on file  Intimate Partner Violence: Unknown (03/24/2022)   Received from [facility]   HITS    Physically Hurt: Not on file    Insult or Talk Down To: Not on file    Threaten Physical Harm: Not on file    Scream or Curse: Not on file    Review of Systems:    Constitutional: No weight loss, fever, chills, weakness or fatigue HEENT: Eyes: No change in vision               Ears, Nose, Throat:  No  change in hearing or congestion Skin: No rash or itching Cardiovascular: No chest pain, chest pressure or palpitations   Respiratory: No SOB or cough Gastrointestinal: See HPI and otherwise negative Genitourinary: No dysuria or change in urinary frequency Neurological: No headache, dizziness or syncope Musculoskeletal: No new muscle or joint pain Hematologic: No bleeding or bruising Psychiatric: No history of depression or anxiety    Physical Exam:  Vital signs: BP 110/70   Pulse 72   Ht 5' 11 (1.803 m)   Wt 196 lb (88.9 kg)   BMI 27.34 kg/m   Constitutional: NAD, alert and cooperative Head:  Normocephalic and atraumatic. Eyes:   PEERL, EOMI. No icterus. Conjunctiva pink. Respiratory: Respirations even and unlabored. Lungs clear to auscultation bilaterally.   No wheezes, crackles, or rhonchi.  Cardiovascular:  Regular rate and rhythm. No peripheral edema, cyanosis or pallor.  Gastrointestinal:  Soft, nondistended, nontender. No rebound or guarding. Normal bowel sounds. No appreciable masses or hepatomegaly. Rectal:  Declines Msk:  Symmetrical without gross deformities. Without edema, no deformity or joint  abnormality.  Neurologic:  Alert and  oriented x4;  grossly normal neurologically.  Skin:   Dry and intact without significant lesions or rashes. Psychiatric: Oriented to person, place and time. Demonstrates good judgement and reason without abnormal affect or behaviors.  Physical Exam    RELEVANT LABS AND IMAGING: CBC    Component Value Date/Time   WBC 9.3 05/18/2023 1816   RBC 5.59 05/18/2023 1816   HGB 15.9 05/18/2023 1816   HCT 45.5 05/18/2023 1816   PLT 176 05/18/2023 1816   MCV 81.4 05/18/2023 1816   MCH 28.4 05/18/2023 1816   MCHC 34.9 05/18/2023 1816   RDW 13.1 05/18/2023 1816    CMP     Component Value Date/Time   NA 140 05/18/2023 1816   K 3.5 05/18/2023 1816   CL 103 05/18/2023 1816   CO2 27 05/18/2023 1816   GLUCOSE 106 (H) 05/18/2023 1816   BUN 12 05/18/2023 1816   CREATININE 0.82 05/18/2023 1816   CALCIUM 10.0 05/18/2023 1816   GFRNONAA >60 05/18/2023 1816     Assessment/Plan:   Eosinophilic esophagitis EGD 11/2022 with eosinophilic esophagitis and stricture s/p dilation with biopsy showing eosinophils too numerous to count despite being on PPI and fluticasone  swallow which resolved his symptoms.  Seen by allergist and put on Dupixent with continued resolution of symptoms per patient report but upon my review of allergist last note in our system 04/2023 he was on fluticasone . - EGD to evaluate for mucosal healing - I thoroughly discussed the procedure with the patient (at bedside) to include nature of the procedure, alternatives, benefits, and risks (including but not limited to bleeding, infection, perforation, anesthesia/cardiac pulmonary complications).  Patient verbalized understanding and gave verbal consent to proceed with procedure.  - Obtain most recent allergy report.  If he is truly on Dupixent and eosinophils are improved on EGD can consider stepdown therapy  Nestor Mollie DEVONNA [facility] 10/31/2024, 3:08 PM  Cc: Nena Rosina CROME, PA-C

## 2024-11-01 ENCOUNTER — Telehealth: Payer: Self-pay

## 2024-11-01 NOTE — Telephone Encounter (Signed)
 Left message for patient to return my call.

## 2024-11-01 NOTE — Telephone Encounter (Signed)
-----   Message from Nestor CHRISTELLA Blower sent at 11/01/2024  3:14 PM EST ----- Regarding: EGD Please se tup EGD with Dr. Albertus for EOE. Thanks so much!

## 2024-11-04 NOTE — Addendum Note (Signed)
 Addended by: MADAN, Brizeida Mcmurry L on: 11/04/2024 10:23 AM   Modules accepted: Orders

## 2024-11-04 NOTE — Telephone Encounter (Signed)
 Patient has been scheduled for a EGD with Dr. Albertus on 12/23/24 at 12:30pm. Informed patient that I will put the instructions on MyChart and to contact me after reviewing if he has any questions. Patient verbalized understanding.

## 2024-12-10 ENCOUNTER — Ambulatory Visit
Admission: RE | Admit: 2024-12-10 | Discharge: 2024-12-10 | Disposition: A | Discharge status: Home / Self Care | Payer: Self-pay | Source: Ambulatory Visit | Attending: Family Medicine | Admitting: Family Medicine

## 2024-12-10 VITALS — BP 110/75 | HR 102 | Temp 98.7°F | Resp 18

## 2024-12-10 DIAGNOSIS — B9789 Other viral agents as the cause of diseases classified elsewhere: Secondary | ICD-10-CM

## 2024-12-10 DIAGNOSIS — J028 Acute pharyngitis due to other specified organisms: Secondary | ICD-10-CM | POA: Diagnosis not present

## 2024-12-10 DIAGNOSIS — J101 Influenza due to other identified influenza virus with other respiratory manifestations: Secondary | ICD-10-CM

## 2024-12-10 LAB — POC SOFIA SARS ANTIGEN FIA: SARS Coronavirus 2 Ag: NEGATIVE

## 2024-12-10 LAB — POCT INFLUENZA A/B
Influenza A, POC: POSITIVE — AB
Influenza B, POC: NEGATIVE

## 2024-12-10 LAB — POCT RAPID STREP A (OFFICE): Rapid Strep A Screen: NEGATIVE

## 2024-12-10 MED ORDER — OSELTAMIVIR PHOSPHATE 75 MG PO CAPS: 75.0000 mg | ORAL_CAPSULE | Freq: Two times a day (BID) | ORAL | 0 refills | Status: DC

## 2024-12-10 NOTE — Discharge Instructions (Addendum)
 Take the Tamiflu  2 times a day for 5 days.  Take 2 doses today Drink lots of water Tylenol or ibuprofen for pain and fever  Water gargles or Chloraseptic spray may help with throat pain Call for problems

## 2024-12-10 NOTE — ED Triage Notes (Signed)
 Per pt, Symptoms started 12/22 around 5am. Sore throat, fever, chills and body aches.  Has been alternating Tylenol and motrin; last dose 8am. Tmax 101.

## 2024-12-10 NOTE — ED Provider Notes (Signed)
 " [name]    CSN: [id number] Arrival date & time: 12/10/24  1203      History   Chief Complaint Chief Complaint  Patient presents with   Sore Throat    Symptoms started 12/22 around 5am. Sore throat, fever, chills and body aches. - Entered by patient    HPI Javier Greene is a 23 y.o. male.   HPI  Patient has a sore throat.  Painful sore throat.  Also slight cough, headache body aches fever and chills.  No known exposure to illness.  Generally healthy  Past Medical History:  Diagnosis Date   ADHD (attention deficit hyperactivity disorder)    Allergy    Asperger syndrome    Asthma    severe when younger, now Albuterol MDI prn, no controllers   Auditory processing disorder    Blunt head trauma 06/26/2000   Clemens forward and hit head on floor, seen in ER, CT SCAN normal   Developmental speech or language disorder    Environmental allergies 07/09/2012   Eosinophilic esophagitis    GERD (gastroesophageal reflux disease) 01/20/2000   Zantac, Prilosec. Resolved.   Learning disability    Neonatal jaundice    Peak bili 11.4   Positional plagiocephaly    helmet, [facility]    Patient Active Problem List   Diagnosis Date Noted   Autism spectrum disorder 09/02/2016   Central auditory processing disorder 09/02/2016   Other seasonal allergic rhinitis 09/15/2014   Environmental allergies 07/09/2012   ADHD (attention deficit hyperactivity disorder) 07/09/2012   Asthma 07/09/2012   Learning disability    Developmental speech or language disorder     Past Surgical History:  Procedure Laterality Date   CIRCUMCISION     MANDIBLE FRACTURE SURGERY     UPPER GASTROINTESTINAL ENDOSCOPY         Home Medications    Prior to Admission medications  Medication Sig Start Date End Date Taking? Authorizing Provider  levocetirizine (XYZAL) 5 MG tablet Take 5 mg by mouth every evening. 05/06/19  Yes [provider]  oseltamivir  (TAMIFLU ) 75 MG capsule Take 1  capsule (75 mg total) by mouth every 12 (twelve) hours. 12/10/24  Yes Maranda Jamee Jacob, MD  pantoprazole  (PROTONIX ) 40 MG tablet Take 1 tablet (40 mg total) by mouth daily. 10/31/24  Yes McMichael, Bayley M, PA-C  EPINEPHrine  (EPIPEN ) 0.3 mg/0.3 mL DEVI Inject 0.3 mLs (0.3 mg total) into the muscle once. 08/30/12   Londell Lynwood NOVAK, MD  fluticasone  (FLONASE ) 50 MCG/ACT nasal spray Place 1 spray into both nostrils daily. 09/15/14 10/31/24  Belenda Macario HERO, NP    Family History Family History  Problem Relation Age of Onset   Lupus Father    Diabetes Maternal Uncle    Colon cancer Maternal Grandmother    Colon polyps Maternal Grandmother    Diabetes Maternal Grandfather    Colon polyps Paternal Grandmother    Diabetes Paternal Grandmother    Diabetes Paternal Grandfather    Esophageal cancer Neg Hx    Stomach cancer Neg Hx    Rectal cancer Neg Hx     Social History Social History[1]   Allergies   Latex, Leguminosae [peanut oil], Shellfish protein-containing drug products, Other, Diphenhydramine, Shellfish allergy, Egg protein-containing drug products, and Peanut (diagnostic)   Review of Systems Review of Systems See HPI  Physical Exam Triage Vital Signs ED Triage Vitals  Encounter Vitals Group     BP 12/10/24 1243 110/75     Girls Systolic BP  Percentile --      Girls Diastolic BP Percentile --      Boys Systolic BP Percentile --      Boys Diastolic BP Percentile --      Pulse Rate 12/10/24 1243 (!) 102     Resp 12/10/24 1243 18     Temp 12/10/24 1243 98.7 F (37.1 C)     Temp src --      SpO2 12/10/24 1243 98 %     Weight --      Height --      Head Circumference --      Peak Flow --      Pain Score 12/10/24 1241 7     Pain Loc --      Pain Education --      Exclude from Growth Chart --    No data found.  Updated Vital Signs BP 110/75 (BP Location: Right Arm)   Pulse (!) 102   Temp 98.7 F (37.1 C)   Resp 18   SpO2 98%      Physical Exam Constitutional:       General: He is not in acute distress.    Appearance: He is well-developed. He is ill-appearing.  HENT:     Head: Normocephalic and atraumatic.     Right Ear: Tympanic membrane normal.     Left Ear: Tympanic membrane normal.     Nose: No congestion.     Mouth/Throat:     Pharynx: Posterior oropharyngeal erythema present. No pharyngeal swelling.     Tonsils: No tonsillar exudate. 1+ on the right. 1+ on the left.  Eyes:     Conjunctiva/sclera: Conjunctivae normal.     Pupils: Pupils are equal, round, and reactive to light.  Cardiovascular:     Rate and Rhythm: Normal rate and regular rhythm.     Heart sounds: Normal heart sounds.  Pulmonary:     Effort: Pulmonary effort is normal. No respiratory distress.     Breath sounds: Normal breath sounds.  Musculoskeletal:        General: Normal range of motion.     Cervical back: Normal range of motion.  Skin:    General: Skin is warm and dry.  Neurological:     Mental Status: He is alert.      UC Treatments / Results  Labs (all labs ordered are listed, but only abnormal results are displayed) Labs Reviewed  POCT INFLUENZA A/B - Abnormal; Notable for the following components:      Result Value   Influenza A, POC Positive (*)    All other components within normal limits  POCT RAPID STREP A (OFFICE) - Normal  POC SOFIA SARS ANTIGEN FIA - Normal    EKG   Radiology No results found.  Procedures Procedures (including critical care time)  Medications Ordered in UC Medications - No data to display  Initial Impression / Assessment and Plan / UC Course  I have reviewed the triage vital signs and the nursing notes.  Pertinent labs & imaging results that were available during my care of the patient were reviewed by me and considered in my medical decision making (see chart for details).     Final Clinical Impressions(s) / UC Diagnoses   Final diagnoses:  Sore throat (viral)  Influenza A     Discharge Instructions       Take the Tamiflu  2 times a day for 5 days.  Take 2 doses today Drink lots of water Tylenol or  ibuprofen for pain and fever  Water gargles or Chloraseptic spray may help with throat pain Call for problems   ED Prescriptions     Medication Sig Dispense Auth. Provider   oseltamivir  (TAMIFLU ) 75 MG capsule Take 1 capsule (75 mg total) by mouth every 12 (twelve) hours. 10 capsule Maranda Jamee Jacob, MD      PDMP not reviewed this encounter.    [1]  Social History Tobacco Use   Smoking status: Never  Vaping Use   Vaping status: Never Used  Substance Use Topics   Alcohol use: Not Currently    Comment: rarely   Drug use: Never     Maranda Jamee Jacob, MD 12/10/24 1355  "

## 2024-12-18 ENCOUNTER — Telehealth: Payer: Self-pay

## 2024-12-18 NOTE — Telephone Encounter (Signed)
 Pt's wife called and stated that pt still had a really bad cough. Pt is trying otc cough medications as well with no relief. Pt's cough is worst at night. Wife was wanting medication called in to pharmacy. Pt was told that he would have to come in and be reevaluated.  LM

## 2024-12-19 ENCOUNTER — Ambulatory Visit
Admission: RE | Admit: 2024-12-19 | Discharge: 2024-12-19 | Disposition: A | Discharge status: Home / Self Care | Source: Home / Self Care | Attending: Family Medicine | Admitting: Family Medicine

## 2024-12-19 VITALS — BP 117/80 | HR 97 | Temp 99.0°F | Resp 20 | Wt 194.6 lb

## 2024-12-19 DIAGNOSIS — J209 Acute bronchitis, unspecified: Secondary | ICD-10-CM | POA: Diagnosis not present

## 2024-12-19 MED ORDER — AZITHROMYCIN 250 MG PO TABS: ORAL_TABLET | ORAL | 0 refills | Status: AC

## 2024-12-19 MED ORDER — PREDNISONE 20 MG PO TABS: 40.0000 mg | ORAL_TABLET | Freq: Every day | ORAL | 0 refills | Status: AC

## 2024-12-19 MED ORDER — HYDROCOD POLI-CHLORPHE POLI ER 10-8 MG/5ML PO SUER: 5.0000 mL | Freq: Two times a day (BID) | ORAL | 0 refills | Status: AC | PRN

## 2024-12-19 NOTE — Discharge Instructions (Signed)
 Make sure you are drinking lots of water Take the Z-Pak antibiotic as directed.  2 pills today then 1 a day until gone Take prednisone  once a day for 5 days I have given you a stronger cough medicine.  You may take it twice a day.  Do not take when you are driving or working, it will help at night See your doctor if not improving by next week

## 2024-12-19 NOTE — ED Provider Notes (Signed)
 " [facility]    CSN: [id number] Arrival date & time: 12/19/24  1104      History   Chief Complaint Chief Complaint  Patient presents with   Cough    Was diagnosed with influenza A at your office on the 23rd. Cough has worsened since then, OTC medication is not working. Cough is waking him up at night during sleep and sometimes will cough until he has the urge to vomit. - Entered by patient    HPI Javier Greene is a 24 y.o. male.   HPI Patient was seen here on December 10, 2024.  He was diagnosed with influenza.  He took the Tamiflu .  He feels somewhat better but continues to have a persistent cough.  He states he has bad coughing spells until he gags.  The cough is keeping him awake at night.  He is not coughing up any sputum.  No chest pain.  Continues feeling somewhat tired Past Medical History:  Diagnosis Date   ADHD (attention deficit hyperactivity disorder)    Allergy    Asperger syndrome    Asthma    severe when younger, now Albuterol MDI prn, no controllers   Auditory processing disorder    Blunt head trauma 06/26/2000   Clemens forward and hit head on floor, seen in ER, CT SCAN normal   Developmental speech or language disorder    Environmental allergies 07/09/2012   Eosinophilic esophagitis    GERD (gastroesophageal reflux disease) 01/20/2000   Zantac, Prilosec. Resolved.   Learning disability    Neonatal jaundice    Peak bili 11.4   Positional plagiocephaly    helmet, [facility]    Patient Active Problem List   Diagnosis Date Noted   Autism spectrum disorder 09/02/2016   Central auditory processing disorder 09/02/2016   Other seasonal allergic rhinitis 09/15/2014   Environmental allergies 07/09/2012   ADHD (attention deficit hyperactivity disorder) 07/09/2012   Asthma 07/09/2012   Learning disability    Developmental speech or language disorder     Past Surgical History:  Procedure Laterality Date   CIRCUMCISION     MANDIBLE FRACTURE SURGERY      UPPER GASTROINTESTINAL ENDOSCOPY         Home Medications    Prior to Admission medications  Medication Sig Start Date End Date Taking? Authorizing Provider  azithromycin  (ZITHROMAX  Z-PAK) 250 MG tablet Take 2 pills right away.  After this take 1 pill a day until gone 12/19/24  Yes Maranda Jamee Jacob, MD  chlorpheniramine-HYDROcodone (TUSSIONEX) 10-8 MG/5ML Take 5 mLs by mouth every 12 (twelve) hours as needed for cough. 12/19/24  Yes Maranda Jamee Jacob, MD  predniSONE  (DELTASONE ) 20 MG tablet Take 2 tablets (40 mg total) by mouth daily with breakfast. 12/19/24  Yes Maranda Jamee Jacob, MD  EPINEPHrine  (EPIPEN ) 0.3 mg/0.3 mL DEVI Inject 0.3 mLs (0.3 mg total) into the muscle once. 08/30/12   Londell Lynwood NOVAK, MD  fluticasone  (FLONASE ) 50 MCG/ACT nasal spray Place 1 spray into both nostrils daily. 09/15/14 10/31/24  Belenda Macario HERO, NP  levocetirizine (XYZAL) 5 MG tablet Take 5 mg by mouth every evening. 05/06/19   [provider]  pantoprazole  (PROTONIX ) 40 MG tablet Take 1 tablet (40 mg total) by mouth daily. 10/31/24   Mollie Nestor HERO, PA-C    Family History Family History  Problem Relation Age of Onset   Lupus Father    Diabetes Maternal Uncle    Colon cancer Maternal Grandmother    Colon  polyps Maternal Grandmother    Diabetes Maternal Grandfather    Colon polyps Paternal Grandmother    Diabetes Paternal Grandmother    Diabetes Paternal Grandfather    Esophageal cancer Neg Hx    Stomach cancer Neg Hx    Rectal cancer Neg Hx     Social History Social History[1]   Allergies   Latex, Leguminosae [peanut oil], Shellfish protein-containing drug products, Other, Diphenhydramine, Shellfish allergy, Egg protein-containing drug products, and Peanut (diagnostic)   Review of Systems Review of Systems See HPI  Physical Exam Triage Vital Signs ED Triage Vitals  Encounter Vitals Group     BP 12/19/24 1117 117/80     Girls Systolic BP Percentile --      Girls Diastolic BP  Percentile --      Boys Systolic BP Percentile --      Boys Diastolic BP Percentile --      Pulse Rate 12/19/24 1117 97     Resp 12/19/24 1117 20     Temp 12/19/24 1117 99 F (37.2 C)     Temp Source 12/19/24 1117 Oral     SpO2 12/19/24 1117 95 %     Weight 12/19/24 1114 194 lb 9.6 oz (88.3 kg)     Height --      Head Circumference --      Peak Flow --      Pain Score 12/19/24 1114 0     Pain Loc --      Pain Education --      Exclude from Growth Chart --    No data found.  Updated Vital Signs BP 117/80 (BP Location: Right Arm)   Pulse 97   Temp 99 F (37.2 C) (Oral)   Resp 20   Wt 88.3 kg   SpO2 95%   BMI 27.14 kg/m      Physical Exam Constitutional:      General: He is not in acute distress.    Appearance: He is well-developed and normal weight.  HENT:     Head: Normocephalic and atraumatic.     Right Ear: Tympanic membrane normal.     Left Ear: Tympanic membrane normal.     Nose: Nose normal. No congestion.     Mouth/Throat:     Pharynx: No posterior oropharyngeal erythema.  Eyes:     Conjunctiva/sclera: Conjunctivae normal.     Pupils: Pupils are equal, round, and reactive to light.  Cardiovascular:     Rate and Rhythm: Normal rate and regular rhythm.     Heart sounds: Normal heart sounds.  Pulmonary:     Effort: Pulmonary effort is normal. No respiratory distress.     Breath sounds: Wheezing and rhonchi present.  Abdominal:     General: There is no distension.     Palpations: Abdomen is soft.  Musculoskeletal:        General: Normal range of motion.     Cervical back: Normal range of motion.  Skin:    General: Skin is warm and dry.  Neurological:     Mental Status: He is alert.      UC Treatments / Results  Labs (all labs ordered are listed, but only abnormal results are displayed) Labs Reviewed - No data to display  EKG   Radiology No results found.  Procedures Procedures (including critical care time)  Medications Ordered in  UC Medications - No data to display  Initial Impression / Assessment and Plan / UC Course  I have reviewed  the triage vital signs and the nursing notes.  Pertinent labs & imaging results that were available during my care of the patient were reviewed by me and considered in my medical decision making (see chart for details).     I feel like the patient's development of bronchitis in the aftermath of his influenza.  Will treat with antibiotics and prednisone .  Return as needed Final Clinical Impressions(s) / UC Diagnoses   Final diagnoses:  Acute bronchitis, unspecified organism     Discharge Instructions      Make sure you are drinking lots of water Take the Z-Pak antibiotic as directed.  2 pills today then 1 a day until gone Take prednisone  once a day for 5 days I have given you a stronger cough medicine.  You may take it twice a day.  Do not take when you are driving or working, it will help at night See your doctor if not improving by next week    ED Prescriptions     Medication Sig Dispense Auth. Provider   azithromycin  (ZITHROMAX  Z-PAK) 250 MG tablet Take 2 pills right away.  After this take 1 pill a day until gone 6 tablet Maranda Jamee Jacob, MD   predniSONE  (DELTASONE ) 20 MG tablet Take 2 tablets (40 mg total) by mouth daily with breakfast. 10 tablet Maranda Jamee Jacob, MD   chlorpheniramine-HYDROcodone (TUSSIONEX) 10-8 MG/5ML Take 5 mLs by mouth every 12 (twelve) hours as needed for cough. 115 mL Maranda Jamee Jacob, MD      I have reviewed the PDMP during this encounter.    [1]  Social History Tobacco Use   Smoking status: Never  Vaping Use   Vaping status: Never Used  Substance Use Topics   Alcohol use: Not Currently    Comment: rarely   Drug use: Never     Maranda Jamee Jacob, MD 12/19/24 1837  "

## 2024-12-19 NOTE — ED Triage Notes (Signed)
 Patient states diagnosed w/ Flu last week, persistent cough w/ post cough emesis at times.  Taking OTC Delsym with little relief

## 2024-12-23 ENCOUNTER — Encounter: Admitting: Internal Medicine

## 2024-12-27 ENCOUNTER — Encounter: Admitting: Internal Medicine

## 2025-02-07 ENCOUNTER — Encounter: Admitting: Internal Medicine
